# Patient Record
Sex: Female | Born: 1995 | Race: Black or African American | Hispanic: No | Marital: Single | Smoking: Never smoker
Health system: Southern US, Community
[De-identification: ages and names within clinical notes are randomized; demographics above are authoritative.]

## PROBLEM LIST (undated history)

## (undated) DIAGNOSIS — E282 Polycystic ovarian syndrome: Secondary | ICD-10-CM

## (undated) DIAGNOSIS — Z889 Allergy status to unspecified drugs, medicaments and biological substances status: Secondary | ICD-10-CM

## (undated) DIAGNOSIS — I10 Essential (primary) hypertension: Secondary | ICD-10-CM

## (undated) DIAGNOSIS — Z8759 Personal history of other complications of pregnancy, childbirth and the puerperium: Secondary | ICD-10-CM

## (undated) HISTORY — PX: NECK SURGERY: SHX720

## (undated) HISTORY — PX: MANDIBLE FRACTURE SURGERY: SHX706

## (undated) HISTORY — DX: Personal history of other complications of pregnancy, childbirth and the puerperium: Z87.59

## (undated) HISTORY — PX: TONSILLECTOMY: SUR1361

---
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
## 2018-02-17 LAB — OB RESULTS CONSOLE RUBELLA ANTIBODY, IGM: Rubella: IMMUNE

## 2018-02-17 LAB — OB RESULTS CONSOLE HEPATITIS B SURFACE ANTIGEN: Hepatitis B Surface Ag: NEGATIVE

## 2018-02-18 LAB — OB RESULTS CONSOLE GC/CHLAMYDIA
Chlamydia: NEGATIVE
Gonorrhea: NEGATIVE

## 2018-04-25 ENCOUNTER — Other Ambulatory Visit (HOSPITAL_COMMUNITY): Payer: Self-pay | Admitting: Obstetrics & Gynecology

## 2018-04-25 DIAGNOSIS — Z3689 Encounter for other specified antenatal screening: Secondary | ICD-10-CM

## 2018-04-27 ENCOUNTER — Encounter (HOSPITAL_COMMUNITY): Payer: Self-pay

## 2018-05-06 ENCOUNTER — Other Ambulatory Visit (HOSPITAL_COMMUNITY): Payer: Self-pay | Admitting: *Deleted

## 2018-05-06 ENCOUNTER — Ambulatory Visit (HOSPITAL_COMMUNITY)
Admission: RE | Admit: 2018-05-06 | Discharge: 2018-05-06 | Disposition: A | Discharge status: Home / Self Care | Payer: Medicaid Other | Source: Ambulatory Visit | Attending: Obstetrics & Gynecology | Admitting: Obstetrics & Gynecology

## 2018-05-06 DIAGNOSIS — Z3A19 19 weeks gestation of pregnancy: Secondary | ICD-10-CM | POA: Diagnosis not present

## 2018-05-06 DIAGNOSIS — Z363 Encounter for antenatal screening for malformations: Secondary | ICD-10-CM | POA: Insufficient documentation

## 2018-05-06 DIAGNOSIS — Z3689 Encounter for other specified antenatal screening: Secondary | ICD-10-CM

## 2018-05-06 DIAGNOSIS — Z362 Encounter for other antenatal screening follow-up: Secondary | ICD-10-CM

## 2018-05-11 NOTE — L&D Delivery Note (Signed)
Operative Delivery Note  Patient pushed for 1 hour after she was noted to be C/C/+2.  There was fetal bradycardia with crowning.  Discussed with patient operative delivery with a vacuum. Verbal consent: obtained from patient.Risks and benefits discussed in detail.  Risks include, but are not limited to the risks of anesthesia, bleeding, infection, damage to maternal tissues, fetal cephalhematoma.  Head determined to be direct OA. Bladder was just previously emptied by removal of foley catheter. Epidural was found to be adequate.  Vertex was +4  station.  The Kiwi vacuum was placed.  With maternal effort and 1 pull,  The head was delivered and restituted to ROA. The Anterior shoulder did not immediately deliver with gentle downward traction and a shoulder dystocia was called. Emergency team arrived.  Pediatric team was already present due to h/o antenatal cardiac arrhythmia. Simultaneous mcRoberts / suprapubic pressure was performed, a right mediolateral episiotomy was performed then Wood's Screw maneuver. The Shoulder was then delivered followed by the body w/o difficulty and the baby quickly placed on the maternal abdomen. The cord was double clamped and cut and the infant was handed to the Pediatricians.  The baby initially was stunned however tone was improved and baby noted to be moving all four extremities and there was no notable defect after the shoulder dystocia.   Cord gases were obtained followed by cord blood. Placenta spontaneously delivered intact noted to have three vessels.  Uterine atony was alleviated by massage and IV pitocin.  Second degree Episiotomy was repaired in routine fashion with 2-0 vicryl.    Patient tolerated delivery well.    Delivery of the head:  First maneuver: 09/14/2018  4:36 PM, Suprapubic Pressure McRoberts Second maneuver: 09/14/2018  4:38 PM, Joseph Art screw (fetal shoulder rotation) Third maneuver: ,   Fourth maneuver:    APGAR: 2, 9; weight 7 lb 2.1 oz (3235 g).    Placenta status: intact, 3 vessels Cord:  with the following complications: .  Cord pH: pending  Anesthesia:  Epidural / 1% lidocaine Episiotomy: Right Mediolateral Lacerations: 2nd degree Suture Repair: 2.0 vicryl Est. Blood Loss (mL):  387   Mom to postpartum.  Baby to Couplet care / Skin to Skin.  Essie Hart STACIA 09/14/2018, 5:15 PM

## 2018-06-09 ENCOUNTER — Ambulatory Visit (HOSPITAL_COMMUNITY)
Admission: RE | Admit: 2018-06-09 | Discharge: 2018-06-09 | Disposition: A | Discharge status: Home / Self Care | Payer: BLUE CROSS/BLUE SHIELD | Source: Ambulatory Visit | Attending: Obstetrics & Gynecology | Admitting: Obstetrics & Gynecology

## 2018-06-09 DIAGNOSIS — Z362 Encounter for other antenatal screening follow-up: Secondary | ICD-10-CM | POA: Diagnosis present

## 2018-06-09 DIAGNOSIS — Z3A24 24 weeks gestation of pregnancy: Secondary | ICD-10-CM | POA: Diagnosis not present

## 2018-07-05 LAB — HIV ANTIBODY (ROUTINE TESTING W REFLEX): HIV Screen 4th Generation wRfx: NONREACTIVE

## 2018-09-13 ENCOUNTER — Other Ambulatory Visit: Payer: Self-pay

## 2018-09-13 ENCOUNTER — Inpatient Hospital Stay (HOSPITAL_COMMUNITY)
Admission: AD | Admit: 2018-09-13 | Discharge: 2018-09-16 | DRG: 806 | Disposition: A | Discharge status: Home / Self Care | Payer: BLUE CROSS/BLUE SHIELD | Attending: Obstetrics & Gynecology | Admitting: Obstetrics & Gynecology

## 2018-09-13 DIAGNOSIS — Z3A38 38 weeks gestation of pregnancy: Secondary | ICD-10-CM

## 2018-09-13 DIAGNOSIS — O9081 Anemia of the puerperium: Secondary | ICD-10-CM | POA: Diagnosis not present

## 2018-09-13 DIAGNOSIS — O99824 Streptococcus B carrier state complicating childbirth: Secondary | ICD-10-CM | POA: Diagnosis present

## 2018-09-13 DIAGNOSIS — O134 Gestational [pregnancy-induced] hypertension without significant proteinuria, complicating childbirth: Secondary | ICD-10-CM | POA: Diagnosis present

## 2018-09-13 DIAGNOSIS — O368390 Maternal care for abnormalities of the fetal heart rate or rhythm, unspecified trimester, not applicable or unspecified: Secondary | ICD-10-CM

## 2018-09-13 DIAGNOSIS — O4292 Full-term premature rupture of membranes, unspecified as to length of time between rupture and onset of labor: Principal | ICD-10-CM | POA: Diagnosis present

## 2018-09-13 DIAGNOSIS — O139 Gestational [pregnancy-induced] hypertension without significant proteinuria, unspecified trimester: Secondary | ICD-10-CM | POA: Diagnosis not present

## 2018-09-13 HISTORY — DX: Polycystic ovarian syndrome: E28.2

## 2018-09-14 ENCOUNTER — Inpatient Hospital Stay (HOSPITAL_COMMUNITY): Payer: BLUE CROSS/BLUE SHIELD

## 2018-09-14 ENCOUNTER — Inpatient Hospital Stay (HOSPITAL_COMMUNITY): Payer: BLUE CROSS/BLUE SHIELD | Admitting: Anesthesiology

## 2018-09-14 ENCOUNTER — Encounter (HOSPITAL_COMMUNITY): Payer: Self-pay | Admitting: *Deleted

## 2018-09-14 ENCOUNTER — Other Ambulatory Visit: Payer: Self-pay

## 2018-09-14 DIAGNOSIS — O368330 Maternal care for abnormalities of the fetal heart rate or rhythm, third trimester, not applicable or unspecified: Secondary | ICD-10-CM | POA: Diagnosis not present

## 2018-09-14 DIAGNOSIS — O42913 Preterm premature rupture of membranes, unspecified as to length of time between rupture and onset of labor, third trimester: Secondary | ICD-10-CM

## 2018-09-14 DIAGNOSIS — Z3A38 38 weeks gestation of pregnancy: Secondary | ICD-10-CM | POA: Diagnosis not present

## 2018-09-14 DIAGNOSIS — O4292 Full-term premature rupture of membranes, unspecified as to length of time between rupture and onset of labor: Secondary | ICD-10-CM | POA: Diagnosis present

## 2018-09-14 DIAGNOSIS — O99213 Obesity complicating pregnancy, third trimester: Secondary | ICD-10-CM | POA: Diagnosis not present

## 2018-09-14 DIAGNOSIS — O9081 Anemia of the puerperium: Secondary | ICD-10-CM | POA: Diagnosis not present

## 2018-09-14 DIAGNOSIS — O99824 Streptococcus B carrier state complicating childbirth: Secondary | ICD-10-CM | POA: Diagnosis present

## 2018-09-14 DIAGNOSIS — O134 Gestational [pregnancy-induced] hypertension without significant proteinuria, complicating childbirth: Secondary | ICD-10-CM | POA: Diagnosis present

## 2018-09-14 DIAGNOSIS — O139 Gestational [pregnancy-induced] hypertension without significant proteinuria, unspecified trimester: Secondary | ICD-10-CM | POA: Diagnosis not present

## 2018-09-14 LAB — COMPREHENSIVE METABOLIC PANEL
ALT: 22 U/L (ref 0–44)
AST: 25 U/L (ref 15–41)
Albumin: 2.6 g/dL — ABNORMAL LOW (ref 3.5–5.0)
Alkaline Phosphatase: 213 U/L — ABNORMAL HIGH (ref 38–126)
Anion gap: 9 (ref 5–15)
BUN: 6 mg/dL (ref 6–20)
CO2: 22 mmol/L (ref 22–32)
Calcium: 9.4 mg/dL (ref 8.9–10.3)
Chloride: 107 mmol/L (ref 98–111)
Creatinine, Ser: 0.72 mg/dL (ref 0.44–1.00)
GFR calc Af Amer: >60 mL/min (ref >60)
GFR calc non Af Amer: >60 mL/min (ref >60)
Glucose, Bld: 88 mg/dL (ref 70–99)
Potassium: 4 mmol/L (ref 3.5–5.1)
Sodium: 138 mmol/L (ref 135–145)
Total Bilirubin: 0.5 mg/dL (ref 0.3–1.2)
Total Protein: 6.3 g/dL — ABNORMAL LOW (ref 6.5–8.1)

## 2018-09-14 LAB — CBC
HCT: 32.5 % — ABNORMAL LOW (ref 36.0–46.0)
Hemoglobin: 11.8 g/dL — ABNORMAL LOW (ref 12.0–15.0)
MCH: 28.2 pg (ref 26.0–34.0)
MCHC: 36.3 g/dL — ABNORMAL HIGH (ref 30.0–36.0)
MCV: 77.6 fL — ABNORMAL LOW (ref 80.0–100.0)
Platelets: 198 10*3/uL (ref 150–400)
RBC: 4.19 MIL/uL (ref 3.87–5.11)
RDW: 13.2 % (ref 11.5–15.5)
WBC: 10 10*3/uL (ref 4.0–10.5)
nRBC: 0 % (ref 0.0–0.2)

## 2018-09-14 LAB — PROTEIN / CREATININE RATIO, URINE
Creatinine, Urine: 175.7 mg/dL
Protein Creatinine Ratio: 0.1 mg/mg{Cre} (ref 0.00–0.15)
Total Protein, Urine: 17 mg/dL

## 2018-09-14 LAB — CBC WITH DIFFERENTIAL/PLATELET
Abs Immature Granulocytes: 0.21 10*3/uL — ABNORMAL HIGH (ref 0.00–0.07)
Basophils Absolute: 0 10*3/uL (ref 0.0–0.1)
Basophils Relative: 0 %
Eosinophils Absolute: 0 10*3/uL (ref 0.0–0.5)
Eosinophils Relative: 0 %
HCT: 24.1 % — ABNORMAL LOW (ref 36.0–46.0)
Hemoglobin: 8.7 g/dL — ABNORMAL LOW (ref 12.0–15.0)
Immature Granulocytes: 1 %
Lymphocytes Relative: 9 %
Lymphs Abs: 1.7 10*3/uL (ref 0.7–4.0)
MCH: 28 pg (ref 26.0–34.0)
MCHC: 36.1 g/dL — ABNORMAL HIGH (ref 30.0–36.0)
MCV: 77.5 fL — ABNORMAL LOW (ref 80.0–100.0)
Monocytes Absolute: 1.8 10*3/uL — ABNORMAL HIGH (ref 0.1–1.0)
Monocytes Relative: 10 %
Neutro Abs: 14.5 10*3/uL — ABNORMAL HIGH (ref 1.7–7.7)
Neutrophils Relative %: 80 %
Platelets: 163 10*3/uL (ref 150–400)
RBC: 3.11 MIL/uL — ABNORMAL LOW (ref 3.87–5.11)
RDW: 13.3 % (ref 11.5–15.5)
WBC: 18.2 10*3/uL — ABNORMAL HIGH (ref 4.0–10.5)
nRBC: 0 % (ref 0.0–0.2)

## 2018-09-14 LAB — POCT FERN TEST: POCT Fern Test: POSITIVE

## 2018-09-14 LAB — URIC ACID: Uric Acid, Serum: 5.6 mg/dL (ref 2.5–7.1)

## 2018-09-14 LAB — TYPE AND SCREEN
ABO/RH(D): O POS
Antibody Screen: NEGATIVE

## 2018-09-14 LAB — OB RESULTS CONSOLE ABO/RH: RH Type: POSITIVE

## 2018-09-14 LAB — OB RESULTS CONSOLE GBS: GBS: POSITIVE

## 2018-09-14 LAB — LACTATE DEHYDROGENASE: LDH: 172 U/L (ref 98–192)

## 2018-09-14 LAB — ABO/RH: ABO/RH(D): O POS

## 2018-09-14 LAB — RPR: RPR Ser Ql: NONREACTIVE

## 2018-09-14 MED ORDER — ACETAMINOPHEN 325 MG PO TABS: 650.0000 mg | ORAL_TABLET | ORAL | Status: DC | PRN

## 2018-09-14 MED ORDER — SOD CITRATE-CITRIC ACID 500-334 MG/5ML PO SOLN: 30.0000 mL | ORAL | Status: DC | PRN

## 2018-09-14 MED ORDER — SODIUM CHLORIDE 0.9 % IV SOLN: 250.0000 mL | INTRAVENOUS | Status: DC | PRN

## 2018-09-14 MED ORDER — OXYCODONE-ACETAMINOPHEN 5-325 MG PO TABS: 2.0000 | ORAL_TABLET | ORAL | Status: DC | PRN

## 2018-09-14 MED ORDER — PHENYLEPHRINE 40 MCG/ML (10ML) SYRINGE FOR IV PUSH (FOR BLOOD PRESSURE SUPPORT): 80.0000 ug | PREFILLED_SYRINGE | INTRAVENOUS | Status: DC | PRN

## 2018-09-14 MED ORDER — PRENATAL MULTIVITAMIN CH
1.0000 | ORAL_TABLET | Freq: Every day | ORAL | Status: DC
  Administered 2018-09-15 – 2018-09-16 (×2): 1 via ORAL
  Filled 2018-09-14 (×2): qty 1

## 2018-09-14 MED ORDER — PENICILLIN G 3 MILLION UNITS IVPB - SIMPLE MED
3.0000 10*6.[IU] | INTRAVENOUS | Status: DC
  Administered 2018-09-14 (×4): 3 10*6.[IU] via INTRAVENOUS
  Filled 2018-09-14 (×3): qty 100

## 2018-09-14 MED ORDER — LACTATED RINGERS IV SOLN: 500.0000 mL | Freq: Once | INTRAVENOUS | Status: DC

## 2018-09-14 MED ORDER — MISOPROSTOL 100 MCG PO TABS
100.0000 ug | ORAL_TABLET | Freq: Once | ORAL | Status: AC
  Administered 2018-09-14: 100 ug via RECTAL
  Filled 2018-09-14: qty 1

## 2018-09-14 MED ORDER — METHYLERGONOVINE MALEATE 0.2 MG/ML IJ SOLN
0.2000 mg | Freq: Once | INTRAMUSCULAR | Status: AC
  Administered 2018-09-14: 0.2 mg via INTRAMUSCULAR
  Filled 2018-09-14: qty 1

## 2018-09-14 MED ORDER — OXYTOCIN 40 UNITS IN NORMAL SALINE INFUSION - SIMPLE MED
1.0000 m[IU]/min | INTRAVENOUS | Status: DC
  Administered 2018-09-14: 2 m[IU]/min via INTRAVENOUS

## 2018-09-14 MED ORDER — FENTANYL CITRATE (PF) 100 MCG/2ML IJ SOLN
50.0000 ug | INTRAMUSCULAR | Status: DC | PRN
  Administered 2018-09-14: 50 ug via INTRAVENOUS
  Filled 2018-09-14 (×2): qty 2

## 2018-09-14 MED ORDER — SENNOSIDES-DOCUSATE SODIUM 8.6-50 MG PO TABS
2.0000 | ORAL_TABLET | ORAL | Status: DC
  Administered 2018-09-14 – 2018-09-15 (×2): 2 via ORAL
  Filled 2018-09-14 (×2): qty 2

## 2018-09-14 MED ORDER — OXYTOCIN 40 UNITS IN NORMAL SALINE INFUSION - SIMPLE MED: 2.5000 [IU]/h | INTRAVENOUS | Status: DC | PRN

## 2018-09-14 MED ORDER — SODIUM CHLORIDE (PF) 0.9 % IJ SOLN
INTRAMUSCULAR | Status: DC | PRN
  Administered 2018-09-14: 14 mL/h via EPIDURAL

## 2018-09-14 MED ORDER — FENTANYL-BUPIVACAINE-NACL 0.5-0.125-0.9 MG/250ML-% EP SOLN
EPIDURAL | Status: AC
  Filled 2018-09-14: qty 250

## 2018-09-14 MED ORDER — SIMETHICONE 80 MG PO CHEW: 80.0000 mg | CHEWABLE_TABLET | ORAL | Status: DC | PRN

## 2018-09-14 MED ORDER — ONDANSETRON HCL 4 MG/2ML IJ SOLN: 4.0000 mg | INTRAMUSCULAR | Status: DC | PRN

## 2018-09-14 MED ORDER — LIDOCAINE HCL (PF) 1 % IJ SOLN
30.0000 mL | INTRAMUSCULAR | Status: AC | PRN
  Administered 2018-09-14: 30 mL via SUBCUTANEOUS
  Filled 2018-09-14: qty 30

## 2018-09-14 MED ORDER — OXYTOCIN BOLUS FROM INFUSION
500.0000 mL | Freq: Once | INTRAVENOUS | Status: AC
  Administered 2018-09-14: 500 mL via INTRAVENOUS

## 2018-09-14 MED ORDER — LACTATED RINGERS IV SOLN: INTRAVENOUS | Status: DC

## 2018-09-14 MED ORDER — ONDANSETRON HCL 4 MG/2ML IJ SOLN
4.0000 mg | Freq: Four times a day (QID) | INTRAMUSCULAR | Status: DC | PRN
  Administered 2018-09-14: 4 mg via INTRAVENOUS
  Filled 2018-09-14: qty 2

## 2018-09-14 MED ORDER — PHENYLEPHRINE 40 MCG/ML (10ML) SYRINGE FOR IV PUSH (FOR BLOOD PRESSURE SUPPORT)
PREFILLED_SYRINGE | INTRAVENOUS | Status: AC
  Filled 2018-09-14: qty 10

## 2018-09-14 MED ORDER — DIPHENHYDRAMINE HCL 25 MG PO CAPS: 25.0000 mg | ORAL_CAPSULE | Freq: Four times a day (QID) | ORAL | Status: DC | PRN

## 2018-09-14 MED ORDER — LACTATED RINGERS IV SOLN
INTRAVENOUS | Status: DC
  Administered 2018-09-14: 06:00:00 via INTRAVENOUS

## 2018-09-14 MED ORDER — BENZOCAINE-MENTHOL 20-0.5 % EX AERO: 1.0000 "application " | INHALATION_SPRAY | CUTANEOUS | Status: DC | PRN

## 2018-09-14 MED ORDER — COCONUT OIL OIL: 1.0000 "application " | TOPICAL_OIL | Status: DC | PRN

## 2018-09-14 MED ORDER — IBUPROFEN 600 MG PO TABS
600.0000 mg | ORAL_TABLET | Freq: Four times a day (QID) | ORAL | Status: DC
  Administered 2018-09-14 – 2018-09-16 (×8): 600 mg via ORAL
  Filled 2018-09-14 (×8): qty 1

## 2018-09-14 MED ORDER — TERBUTALINE SULFATE 1 MG/ML IJ SOLN: 0.2500 mg | Freq: Once | INTRAMUSCULAR | Status: DC | PRN

## 2018-09-14 MED ORDER — METHYLERGONOVINE MALEATE 0.2 MG PO TABS
0.2000 mg | ORAL_TABLET | Freq: Four times a day (QID) | ORAL | Status: AC
  Administered 2018-09-15 (×4): 0.2 mg via ORAL
  Filled 2018-09-14 (×4): qty 1

## 2018-09-14 MED ORDER — FERROUS SULFATE 325 (65 FE) MG PO TABS
325.0000 mg | ORAL_TABLET | Freq: Two times a day (BID) | ORAL | Status: DC
  Administered 2018-09-15 – 2018-09-16 (×4): 325 mg via ORAL
  Filled 2018-09-14 (×4): qty 1

## 2018-09-14 MED ORDER — TETANUS-DIPHTH-ACELL PERTUSSIS 5-2.5-18.5 LF-MCG/0.5 IM SUSP: 0.5000 mL | Freq: Once | INTRAMUSCULAR | Status: DC

## 2018-09-14 MED ORDER — ONDANSETRON HCL 4 MG PO TABS: 4.0000 mg | ORAL_TABLET | ORAL | Status: DC | PRN

## 2018-09-14 MED ORDER — SODIUM CHLORIDE 0.9 % IV SOLN
5.0000 10*6.[IU] | Freq: Once | INTRAVENOUS | Status: AC
  Administered 2018-09-14: 5 10*6.[IU] via INTRAVENOUS
  Filled 2018-09-14: qty 5

## 2018-09-14 MED ORDER — OXYTOCIN 40 UNITS IN NORMAL SALINE INFUSION - SIMPLE MED
2.5000 [IU]/h | INTRAVENOUS | Status: DC
  Filled 2018-09-14: qty 1000

## 2018-09-14 MED ORDER — ZOLPIDEM TARTRATE 5 MG PO TABS: 5.0000 mg | ORAL_TABLET | Freq: Every evening | ORAL | Status: DC | PRN

## 2018-09-14 MED ORDER — MISOPROSTOL 50MCG HALF TABLET
50.0000 ug | ORAL_TABLET | Freq: Once | ORAL | Status: DC
  Filled 2018-09-14: qty 1

## 2018-09-14 MED ORDER — MISOPROSTOL 200 MCG PO TABS
ORAL_TABLET | ORAL | Status: AC
  Filled 2018-09-14: qty 5

## 2018-09-14 MED ORDER — MISOPROSTOL 200 MCG PO TABS
1000.0000 ug | ORAL_TABLET | Freq: Once | ORAL | Status: AC
  Administered 2018-09-14: 1000 ug via RECTAL
  Filled 2018-09-14: qty 10

## 2018-09-14 MED ORDER — EPHEDRINE 5 MG/ML INJ: 10.0000 mg | INTRAVENOUS | Status: DC | PRN

## 2018-09-14 MED ORDER — DIPHENHYDRAMINE HCL 50 MG/ML IJ SOLN: 12.5000 mg | INTRAMUSCULAR | Status: DC | PRN

## 2018-09-14 MED ORDER — WITCH HAZEL-GLYCERIN EX PADS: 1.0000 "application " | MEDICATED_PAD | CUTANEOUS | Status: DC | PRN

## 2018-09-14 MED ORDER — ACETAMINOPHEN 325 MG PO TABS
650.0000 mg | ORAL_TABLET | ORAL | Status: DC | PRN
  Administered 2018-09-14 – 2018-09-15 (×3): 650 mg via ORAL
  Filled 2018-09-14 (×3): qty 2

## 2018-09-14 MED ORDER — MISOPROSTOL 200 MCG PO TABS
ORAL_TABLET | ORAL | Status: AC
  Administered 2018-09-14: 1000 ug via RECTAL
  Filled 2018-09-14: qty 5

## 2018-09-14 MED ORDER — OXYCODONE HCL 5 MG PO TABS
10.0000 mg | ORAL_TABLET | ORAL | Status: DC | PRN
  Administered 2018-09-14 – 2018-09-15 (×2): 10 mg via ORAL
  Filled 2018-09-14 (×2): qty 2

## 2018-09-14 MED ORDER — DIBUCAINE (PERIANAL) 1 % EX OINT: 1.0000 "application " | TOPICAL_OINTMENT | CUTANEOUS | Status: DC | PRN

## 2018-09-14 MED ORDER — SODIUM CHLORIDE 0.9% FLUSH: 3.0000 mL | Freq: Two times a day (BID) | INTRAVENOUS | Status: DC

## 2018-09-14 MED ORDER — SODIUM CHLORIDE 0.9% FLUSH: 3.0000 mL | INTRAVENOUS | Status: DC | PRN

## 2018-09-14 MED ORDER — FENTANYL CITRATE (PF) 100 MCG/2ML IJ SOLN
50.0000 ug | Freq: Once | INTRAMUSCULAR | Status: AC
  Administered 2018-09-14: 50 ug via INTRAVENOUS
  Filled 2018-09-14: qty 2

## 2018-09-14 MED ORDER — LACTATED RINGERS IV SOLN
500.0000 mL | INTRAVENOUS | Status: DC | PRN
  Administered 2018-09-14: 999 mL via INTRAVENOUS

## 2018-09-14 MED ORDER — FENTANYL-BUPIVACAINE-NACL 0.5-0.125-0.9 MG/250ML-% EP SOLN: 12.0000 mL/h | EPIDURAL | Status: DC | PRN

## 2018-09-14 MED ORDER — LIDOCAINE HCL (PF) 1 % IJ SOLN
INTRAMUSCULAR | Status: DC | PRN
  Administered 2018-09-14: 11 mL via EPIDURAL

## 2018-09-14 MED ORDER — OXYCODONE-ACETAMINOPHEN 5-325 MG PO TABS: 1.0000 | ORAL_TABLET | ORAL | Status: DC | PRN

## 2018-09-14 NOTE — Anesthesia Preprocedure Evaluation (Signed)
Anesthesia Evaluation  Patient identified by MRN, date of birth, ID band Patient awake    Reviewed: Allergy & Precautions, NPO status , Patient's Chart, lab work & pertinent test results  Airway Mallampati: II  TM Distance: >3 FB Neck ROM: Full    Dental no notable dental hx.    Pulmonary neg pulmonary ROS,    Pulmonary exam normal breath sounds clear to auscultation       Cardiovascular negative cardio ROS Normal cardiovascular exam Rhythm:Regular Rate:Normal     Neuro/Psych negative neurological ROS  negative psych ROS   GI/Hepatic negative GI ROS, Neg liver ROS,   Endo/Other  negative endocrine ROS  Renal/GU negative Renal ROS  negative genitourinary   Musculoskeletal negative musculoskeletal ROS (+)   Abdominal (+) + obese,   Peds negative pediatric ROS (+)  Hematology negative hematology ROS (+)   Anesthesia Other Findings   Reproductive/Obstetrics (+) Pregnancy                             Anesthesia Physical Anesthesia Plan  ASA: II  Anesthesia Plan: Epidural   Post-op Pain Management:    Induction:   PONV Risk Score and Plan:   Airway Management Planned:   Additional Equipment:   Intra-op Plan:   Post-operative Plan:   Informed Consent:   Plan Discussed with:   Anesthesia Plan Comments:         Anesthesia Quick Evaluation  

## 2018-09-14 NOTE — Progress Notes (Signed)
[name] is a 23 y.o. G1P0 at 88 weeks admitted for rupture of membranes, early labor.  Subjective: Patient comfortable with epidural. Per Dr. Irineo Axon recommendation, discussed placing FSE to better monitor baby's heartrate. Patient consented. Arrhythmia is more audible with FSE in place, fetal heart rate will have runs of multiple skipped beats in a short period of time and then have a longer section of time with a regular heartbeat. The dropped/skipped beats occur irregularly.   Objective: Vitals:   09/14/18 0405 09/14/18 0409 09/14/18 0410 09/14/18 0601  BP: 132/81  129/77 128/76  Pulse: (!) 106  (!) 103 96  Resp: 18  18 18   Temp:  98.3 F (36.8 C)    TempSrc:  Oral    SpO2: 100%  100%   Weight:      Height:      ' FHT:  FHR: 140s bpm, variability: moderate,  accelerations:  Present,  decelerations:  Absent, arrhythmia presents on tracing as artifact UC:   regular, every 2-3 minutes SVE:   Dilation: 4 Effacement (%): 90 Station: -1 Exam by:: [employer]  Labs: Lab Results  Component Value Date   WBC 10.0 09/14/2018   HGB 11.8 (L) 09/14/2018   HCT 32.5 (L) 09/14/2018   MCV 77.6 (L) 09/14/2018   PLT 198 09/14/2018    Assessment / Plan: Spontaneous labor, progressing normally  Labor: Progressing normally Preeclampsia:  no signs or symptoms of toxicity, intake and ouput balanced and labs stable Fetal Wellbeing:  Category I Pain Control:  Epidural I/D:  n/a Anticipated MOD:  NSVD  Janeece Riggers 09/14/2018, 6:10 AM

## 2018-09-14 NOTE — Progress Notes (Signed)
Called to patient's room by nurse tech at 1930. Entered room to find patient in the bathroom with nurse tech. Nurse tech informed RN 1 that patient had passed a large clot in her pad and had also passed another large clot in the toilet. Patient stated she felt "hot". Vital signs taken. BP 105/65, HR 121, RR 20, Temp 99.5 and O2 sat 95%. RN remained with patient while nurse tech grabbed supplies. RN exited room to call for a triton scale to weigh blood loss. Patient's off-going RN, RN 2, entered room with RN 1 to assist. Patient returned to bed using the stedy due to complaints of feeling dizzy. Charge nurse was called to room for assistance. RN 2 and charge nurse performed fundal massage while RN 1 weighed pads. EBL 256 per triton scale. RN 2 reported fundus was now 3/U after it had been 1/U prior to clots. Charge nurse informed patient that we would be calling her physician to see what they would like [place] to do. RN 1 called physician. Spoke with Dr. Sallye Ober. Given orders for Cytotec rectally and informed that [facility], CNM would be coming to look at patient. Charge nurse accompanied CNM into patients room. CNM wanted to bladder scan patient and go from there. Given verbal order for 50 mcg of Fentanyl IV. RN 1 accompanied CNM to patient's room. Bladder scanner showed only 28 mLs in the bladder. CNM decided to attempt a manual extraction on the patient. Patient given 650 mg Tylenol, 600 mg Motrin. Attempted to give fentanyl IV push but was unsuccessful due to IV occlusion. IV removed by charge nurse and fentanyl given IM per CNM instructions. Also given verbal order for Oxycodone rapid release 10 mg. Manual extraction attempted with only small amounts of clot removed. CNM reported patient's fundus was "coming down", so she wanted to go ahead and give the cytotec. 1000 mg Cytotec given rectally. Patient and family informed of the plan to keep a close eye on her bleeding and to call if any more bleeding or clots  occurred. RN told to notify CNM if needed.

## 2018-09-14 NOTE — MAU Note (Signed)
Pt came to hospital with c/o vag spotting and contractions and once arrived her water broke. Baby moving well.

## 2018-09-14 NOTE — Lactation Note (Signed)
This note was copied from a baby's chart. Lactation Consultation Note Baby 5 hrs old. Mom states BF well after delivery but hasn't wanted to feed since.  Mom holding baby STS in cradle position. LC attempted to latch baby. Baby took a couple of sucks went to sleep. Mom cont. To hold STS. Mom had PPH after deliver, hasn't had a lot of cuddle time per mom. Mom has DX: of PCOS. Space between tubular breast noted. Lt. Breast size larger than Rt. Breast. Mom shown how to use DEBP & how to disassemble, clean, & reassemble parts. Encouraged to pump 5-6 times a day at this time. Hand expression demonstrated drop of colostrum noted. Nipples are short shaft, everts well w/stimulation. Newborn behavior, STS, I&O, cluster feeding, breast massage, positioning, support, body alignment, supply and demand discussed. Encouraged mom to stimulate baby to feed q 3 hrs if hasn't cued. Encouraged to call for assistance if needed. Brochure left at bedside.  Patient Name: [name] Today's Date: 09/14/2018 Reason for consult: Initial assessment;1st time breastfeeding;Early term 37-38.6wks;Maternal endocrine disorder Type of Endocrine Disorder?: PCOS   Maternal Data Has patient been taught Hand Expression?: Yes Does the patient have breastfeeding experience prior to this delivery?: No  Feeding Feeding Type: Breast Fed  LATCH Score Latch: Too sleepy or reluctant, no latch achieved, no sucking elicited.  Audible Swallowing: None  Type of Nipple: Everted at rest and after stimulation(very short shaft)  Comfort (Breast/Nipple): Soft / non-tender  Hold (Positioning): Full assist, staff holds infant at breast  LATCH Score: 4  Interventions Interventions: Breast feeding basics reviewed;Breast compression;Assisted with latch;Adjust position;Skin to skin;Support pillows;DEBP;Breast massage;Position options;Hand express;Pre-pump if needed;Shells  Lactation Tools Discussed/Used Tools: Pump Breast pump  type: Double-Electric Breast Pump WIC Program: Yes Pump Review: Setup, frequency, and cleaning;Milk Storage Initiated by:: Peri Jefferson RN IBCLC Date initiated:: 09/14/18   Consult Status Consult Status: Follow-up Date: 09/15/18 Follow-up type: In-patient    Charyl Dancer 09/14/2018, 10:28 PM

## 2018-09-14 NOTE — H&P (Signed)
Catherine Catherine BuntingM Lucero is a 23 y.o. female presenting for rupture of membranes and early labor. OB History    Gravida  1   Para      Term      Preterm      AB      Living        SAB      TAB      Ectopic      Multiple      Live Births             Past Medical History:  Diagnosis Date  . PCOS (polycystic ovarian syndrome)    Past Surgical History:  Procedure Laterality Date  . MANDIBLE FRACTURE SURGERY    . NECK SURGERY    . TONSILLECTOMY     Family History: family history is not on file. Social History:  reports that she has never smoked. She has never used smokeless tobacco. She reports that she does not drink alcohol or use drugs.     Maternal Diabetes: No Genetic Screening: Normal Maternal Ultrasounds/Referrals: Normal Fetal Ultrasounds or other Referrals:  Referred to Materal Fetal Medicine , Other: U/S 09/08/18 for S>D, VTX, POSTERIOR PLACENTA, AFI 20.8, EFW 6+14, 57%ILE, HC 13%. Maternal Substance Abuse:  No Significant Maternal Medications:  None Significant Maternal Lab Results:  Lab values include: Group B Strep positive Other Comments:  None  Review of Systems  Eyes: Negative for blurred vision and double vision.  Gastrointestinal: Positive for abdominal pain.  Neurological: Negative for headaches.  All other systems reviewed and are negative.  Maternal Medical History:  Reason for admission: Rupture of membranes and contractions.   Contractions: Onset was 3-5 hours ago.   Frequency: regular.   Perceived severity is strong.    Fetal activity: Perceived fetal activity is normal.   Last perceived fetal movement was within the past hour.    Prenatal Complications - Diabetes: none.    Dilation: 1.5 Effacement (%): 70 Station: 0 Exam by:: Catherine Perlhandra Phillips RN   Vitals:   09/14/18 0045 09/14/18 0137 09/14/18 0203 09/14/18 0209  BP: (!) 141/94 134/71    Pulse: 99 90    Resp: 18 18    Temp: 98.4 F (36.9 C)   98.3 F (36.8 C)  TempSrc:  Oral   Oral  SpO2: 100%     Weight:   109.3 kg   Height:   5\' 9"  (1.753 m)    Results for orders placed or performed during the hospital encounter of 09/13/18 (from the past 24 hour(s))  POCT fern test     Status: None   Collection Time: 09/14/18 12:47 AM  Result Value Ref Range   POCT Fern Test Positive = ruptured amniotic membanes   Type and screen [facility]     Status: None   Collection Time: 09/14/18  1:00 AM  Result Value Ref Range   ABO/RH(D) O POS    Antibody Screen NEG    Sample Expiration      09/17/2018,2359 Performed at [facility], [address] [place], [place] [address]   ABO/Rh     Status: None   Collection Time: 09/14/18  1:10 AM  Result Value Ref Range   ABO/RH(D)      O POS Performed at [facility], [address] [place], [place] [address]   CBC     Status: Abnormal   Collection Time: 09/14/18  1:17 AM  Result Value Ref Range  WBC 10.0 4.0 - 10.5 K/uL   RBC 4.19 3.87 - 5.11 MIL/uL   Hemoglobin 11.8 (L) 12.0 - 15.0 g/dL   HCT 09.3 (L) 81.8 - 29.9 %   MCV 77.6 (L) 80.0 - 100.0 fL   MCH 28.2 26.0 - 34.0 pg   MCHC 36.3 (H) 30.0 - 36.0 g/dL   RDW 37.1 69.6 - 78.9 %   Platelets 198 150 - 400 K/uL   nRBC 0.0 0.0 - 0.2 %  Comprehensive metabolic panel     Status: Abnormal   Collection Time: 09/14/18  1:17 AM  Result Value Ref Range   Sodium 138 135 - 145 mmol/L   Potassium 4.0 3.5 - 5.1 mmol/L   Chloride 107 98 - 111 mmol/L   CO2 22 22 - 32 mmol/L   Glucose, Bld 88 70 - 99 mg/dL   BUN 6 6 - 20 mg/dL   Creatinine, Ser 3.81 0.44 - 1.00 mg/dL   Calcium 9.4 8.9 - 01.7 mg/dL   Total Protein 6.3 (L) 6.5 - 8.1 g/dL   Albumin 2.6 (L) 3.5 - 5.0 g/dL   AST 25 15 - 41 U/L   ALT 22 0 - 44 U/L   Alkaline Phosphatase 213 (H) 38 - 126 U/L   Total Bilirubin 0.5 0.3 - 1.2 mg/dL   GFR calc non Af Amer >60 >60 mL/min   GFR calc Af Amer >60 >60 mL/min   Anion gap 9 5 - 15    Maternal Exam:  Abdomen: Fundal height is Size=dates.    Estimated fetal weight is 7lbs.    Introitus: Normal vulva. Normal vagina.  Pelvis: adequate for delivery.   Cervix: Cervix evaluated by digital exam.     Fetal Exam Fetal Monitor Review: Mode: ultrasound.   Baseline rate: 140s.  Variability: moderate (6-25 bpm).   Pattern: accelerations present and no decelerations.    Fetal State Assessment: Category I - tracings are normal.    Audible fetal heart arrhythmia. Arrythmia is noted as drop or skipped heart beat which occurs irregularly.  Physical Exam  Genitourinary:    Vulva normal.     Prenatal labs: ABO, Rh: --/--/O POS Performed at [facility], [address] [place], [place] [address]  [phone]) Antibody: NEG (05/06 0100) Rubella:  Immune RPR:   NR HBsAg:   NR HIV:   NR GBS:   Positive   Assessment/Plan: 23 y.o. G1P0 at 38w Rupture of membranes in early labor Category 1 FHTs, reactive NST Consulted Dr. Sallye Ober regarding fetal heart arrhythmia:  -FSE when able to place  -NICU present at delivery  -Ultrasound to assess fetal cardiac activity and rule out evidence of hydrops  Elevated BPs seen, patient denies symptoms of preeclampsia, PIH labs pending  Admit to L&D Penicillin for GBS+ Anticipate NSVD  [name] 09/14/2018, 2:19 AM

## 2018-09-14 NOTE — Progress Notes (Signed)
[name] is a 23 y.o. G1P0 at [redacted]w[redacted]d by LMP admitted for PROM  Subjective: Patient comfortable with epidural   Objective: BP (!) 142/83   Pulse (!) 103   Temp 98.4 F (36.9 C) (Oral)   Resp 18   Ht 5\' 9"  (1.753 m)   Wt 109.3 kg   SpO2 100%   BMI 35.59 kg/m  I/O last 3 completed shifts: In: -  Out: 100 [Urine:100] No intake/output data recorded.  FHT:  FHR: 145 bpm, variability: moderate,  accelerations:  Present,  decelerations:  Absent UC:   irregular, every 4-10 minutes SVE:   Dilation: 5 Effacement (%): 80 Station: -2 Exam by: Dr. Mora Appl  Labs: Lab Results  Component Value Date   WBC 10.0 09/14/2018   HGB 11.8 (L) 09/14/2018   HCT 32.5 (L) 09/14/2018   MCV 77.6 (L) 09/14/2018   PLT 198 09/14/2018    Assessment / Plan: SIUP at term PROM Will augment labor with Pitocin 2x2 Patient comfortable with epidural  Cat I tracing IUPC placed as Toco not picking up ctx    Takoya Jonas STACIA 09/14/2018, 7:49 AM

## 2018-09-14 NOTE — Anesthesia Procedure Notes (Signed)
Epidural Patient location during procedure: OB Start time: 09/14/2018 3:33 AM End time: 09/14/2018 3:48 AM  Staffing Anesthesiologist: Lowella Curb, MD Performed: anesthesiologist   Preanesthetic Checklist Completed: patient identified, site marked, surgical consent, pre-op evaluation, timeout performed, IV checked, risks and benefits discussed and monitors and equipment checked  Epidural Patient position: sitting Prep: ChloraPrep Patient monitoring: heart rate, cardiac monitor, continuous pulse ox and blood pressure Approach: midline Location: L2-L3 Injection technique: LOR saline  Needle:  Needle type: Tuohy  Needle gauge: 17 G Needle length: 9 cm Needle insertion depth: 7 cm Catheter type: closed end flexible Catheter size: 20 Guage Catheter at skin depth: 12 cm Test dose: negative  Assessment Events: blood not aspirated, injection not painful, no injection resistance, negative IV test and no paresthesia  Additional Notes Reason for block:procedure for pain

## 2018-09-14 NOTE — Progress Notes (Signed)
[place] KAMARIYA IVERSEN is a 23 y.o. G1P0 at [redacted]w[redacted]d by LMP admitted for PROM  Subjective: Patient feeling more pressure  Objective: BP 121/79   Pulse 97   Temp 98.6 F (37 C) (Oral)   Resp 20   Ht 5\' 9"  (1.753 m)   Wt 109.3 kg   SpO2 100%   BMI 35.59 kg/m  I/O last 3 completed shifts: In: -  Out: 100 [Urine:100] Total I/O In: -  Out: 700 [Urine:700]  FHT:  FHR: 140 bpm, variability: moderate,  accelerations:  Present,  decelerations:  Absent UC:   regular, every 2-3 minutes SVE:   Dilation: 8.5 Effacement (%): 90 Station: Plus 1 Exam by:: Dr. Mora Appl  Labs: Lab Results  Component Value Date   WBC 10.0 09/14/2018   HGB 11.8 (L) 09/14/2018   HCT 32.5 (L) 09/14/2018   MCV 77.6 (L) 09/14/2018   PLT 198 09/14/2018    Assessment / Plan: Augmentation of labor, progressing well  Labor: Progressing normally Preeclampsia:  no signs or symptoms of toxicity Fetal Wellbeing:  Category I Pain Control:  Epidural I/D:  n/a Anticipated MOD:  NSVD  Dorean Daniello STACIA 09/14/2018, 12:47 PM

## 2018-09-14 NOTE — Progress Notes (Addendum)
Subjective: Postpartum Day # 0 : S/P NSVD due to AROM with early labor. Pt delivery consisted of vacuumed assisted with shoulder dystocia and medio-lateral episiotomy.  Patient up ad lib, denies syncope or dizziness. Reports consuming regular diet without issues and denies N/V. Patient reports 0 bowel movement + passing flatus.  Denies issues with urination and reports bleeding is "lstill moderate." RN reported having several clots in toilet post urinating. Fundal height was reported to be @ +3.  Patient is breastfeeding and reports going well.  Desires unknwo for postpartum contraception.  Pain is being appropriately managed with use of po meds. Pt stable denies syncopy, heart palppitation ,.denies dizxiness, denies cp or sob. Pt denies HA, RUQ pain or vision changes.   2nf medio-lateral episiotomy laceration Feeding:  Breast Contraceptive plan:  unknown BB: Circ out pt desired.   Objective: Vital signs in last 24 hours: Patient Vitals for the past 24 hrs:  BP Temp Temp src Pulse Resp SpO2 Height Weight  09/14/18 1930 105/65 99.5 F (37.5 C) Oral (!) 121 20 95 % - -  09/14/18 1845 128/89 99.5 F (37.5 C) Axillary (!) 119 19 - - -  09/14/18 1802 (!) 133/97 99.9 F (37.7 C) Axillary (!) 120 18 - - -  09/14/18 1746 (!) 146/93 - - (!) 110 20 - - -  09/14/18 1731 133/85 - - (!) 114 18 - - -  09/14/18 1716 (!) 148/101 - - (!) 118 16 - - -  09/14/18 1701 (!) 126/100 - - - 18 - - -  09/14/18 1648 (!) 132/95 - - (!) 124 - - - -  09/14/18 1501 121/66 99.5 F (37.5 C) Oral (!) 104 20 - - -  09/14/18 1402 (!) 115/58 99.4 F (37.4 C) Oral (!) 105 16 - - -  09/14/18 1330 120/61 - - 97 18 - - -  09/14/18 1300 (!) 139/91 99 F (37.2 C) Oral (!) 114 20 - - -  09/14/18 1230 121/79 - - 97 20 - - -  09/14/18 1201 100/62 - - 99 16 - - -  09/14/18 1131 (!) 107/52 - - 94 16 - - -  09/14/18 1101 (!) 108/56 98.6 F (37 C) Oral 96 18 - - -  09/14/18 1030 - - - - 16 - - -  09/14/18 1002 131/87 - - 97 - - -  -  09/14/18 0930 - 99.1 F (37.3 C) Oral - - - - -  09/14/18 0901 116/65 - - (!) 106 18 - - -  09/14/18 0801 103/64 - - (!) 122 16 - - -  09/14/18 0730 - 98.9 F (37.2 C) Oral - - - - -  09/14/18 0701 (!) 142/83 - - (!) 103 18 - - -  09/14/18 0644 - 98.4 F (36.9 C) Oral - - - - -  09/14/18 0601 128/76 - - 96 18 - - -  09/14/18 0410 129/77 - - (!) 103 18 100 % - -  09/14/18 0409 - 98.3 F (36.8 C) Oral - - - - -  09/14/18 0405 132/81 - - (!) 106 18 100 % - -  09/14/18 0400 129/70 - - (!) 110 18 100 % - -  09/14/18 0355 109/68 - - (!) 112 18 100 % - -  09/14/18 0350 121/70 - - (!) 124 18 99 % - -  09/14/18 0345 (!) 142/67 - - (!) 128 18 99 % - -  09/14/18 0340 (!) 147/95 - - Marland Kitchen(!)  105 18 99 % - -  09/14/18 0335 - - - - - 98 % - -  09/14/18 0330 - - - - - 99 % - -  09/14/18 0325 (!) 151/106 - - (!) 106 18 98 % - -  09/14/18 0320 - - - - - 98 % - -  09/14/18 0315 - - - - - 98 % - -  09/14/18 0310 - - - - - 100 % - -  09/14/18 0305 (!) 138/101 - - (!) 112 18 98 % - -  09/14/18 0209 - 98.3 F (36.8 C) Oral - - - - -  09/14/18 0203 - - - - - - 5\' 9"  (1.753 m) 109.3 kg  09/14/18 0137 134/71 - - 90 18 - - -  09/14/18 0045 (!) 141/94 98.4 F (36.9 C) Oral 99 18 100 % - -  09/14/18 0024 (!) 139/96 98.7 F (37.1 C) Oral 99 16 - 5\' 9"  (1.753 m) 112.9 kg     Physical Exam:  General: alert, cooperative, appears stated age and no distress Mood/Affect: flat Lungs: clear to auscultation, no wheezes, rales or rhonchi, symmetric air entry.  Heart: normal rate, regular rhythm, normal S1, S2, no murmurs, rubs, clicks or gallops. Breast: breasts appear normal, no suspicious masses, no skin or nipple changes or axillary nodes. Abdomen:  + bowel sounds, soft, non-tender GU: perineum approximate, healing well. No signs of external hematomas.  Uterine Fundus: firm now @ +1/U Lochia: appropriate Skin: Warm, Dry. DVT Evaluation: No evidence of DVT seen on physical exam. Negative Homan's sign. No  cords or calf tenderness. No significant calf/ankle edema. 2+ Patellar DTR, no clonus.   Bladder scan:  PP QBL: with clots  CBC Latest Ref Rng & Units 09/14/2018 10/23/2010  WBC 4.0 - 10.5 K/uL 10.0 6.4  Hemoglobin 12.0 - 15.0 g/dL 11.8(L) 13.9  Hematocrit 36.0 - 46.0 % 32.5(L) 39.7  Platelets 150 - 400 K/uL 198 238    Results for orders placed or performed during the hospital encounter of 09/13/18 (from the past 24 hour(s))  OB RESULTS CONSOLE ABO/Rh     Status: None   Collection Time: 09/14/18 12:00 AM  Result Value Ref Range   RH Type  Positive    ABO Grouping O   OB RESULT CONSOLE Group B Strep     Status: None   Collection Time: 09/14/18 12:00 AM  Result Value Ref Range   GBS Positive   POCT fern test     Status: None   Collection Time: 09/14/18 12:47 AM  Result Value Ref Range   POCT Fern Test Positive = ruptured amniotic membanes   Type and screen [facility]     Status: None   Collection Time: 09/14/18  1:00 AM  Result Value Ref Range   ABO/RH(D) O POS    Antibody Screen NEG    Sample Expiration      09/17/2018,2359 Performed at [facility], [address] [place], [place] [address]   ABO/Rh     Status: None   Collection Time: 09/14/18  1:10 AM  Result Value Ref Range   ABO/RH(D)      O POS Performed at [facility], [address], [place], [place] [address]   CBC     Status: Abnormal   Collection Time: 09/14/18  1:17 AM  Result Value Ref Range   WBC 10.0 4.0 - 10.5 K/uL   RBC 4.19 3.87 -  5.11 MIL/uL   Hemoglobin 11.8 (L) 12.0 - 15.0 g/dL   HCT 60.4 (L) 54.0 - 98.1 %   MCV 77.6 (L) 80.0 - 100.0 fL   MCH 28.2 26.0 - 34.0 pg   MCHC 36.3 (H) 30.0 - 36.0 g/dL   RDW 19.1 47.8 - 29.5 %   Platelets 198 150 - 400 K/uL   nRBC 0.0 0.0 - 0.2 %  RPR     Status: None   Collection Time: 09/14/18  1:17 AM  Result Value Ref Range   RPR Ser Ql Non Reactive Non Reactive  Comprehensive metabolic panel     Status: Abnormal    Collection Time: 09/14/18  1:17 AM  Result Value Ref Range   Sodium 138 135 - 145 mmol/L   Potassium 4.0 3.5 - 5.1 mmol/L   Chloride 107 98 - 111 mmol/L   CO2 22 22 - 32 mmol/L   Glucose, Bld 88 70 - 99 mg/dL   BUN 6 6 - 20 mg/dL   Creatinine, Ser 6.21 0.44 - 1.00 mg/dL   Calcium 9.4 8.9 - 30.8 mg/dL   Total Protein 6.3 (L) 6.5 - 8.1 g/dL   Albumin 2.6 (L) 3.5 - 5.0 g/dL   AST 25 15 - 41 U/L   ALT 22 0 - 44 U/L   Alkaline Phosphatase 213 (H) 38 - 126 U/L   Total Bilirubin 0.5 0.3 - 1.2 mg/dL   GFR calc non Af Amer >60 >60 mL/min   GFR calc Af Amer >60 >60 mL/min   Anion gap 9 5 - 15  Protein / creatinine ratio, urine     Status: None   Collection Time: 09/14/18  1:17 AM  Result Value Ref Range   Creatinine, Urine 175.70 mg/dL   Total Protein, Urine 17 mg/dL   Protein Creatinine Ratio 0.10 0.00 - 0.15 mg/mg[Cre]  Lactate dehydrogenase     Status: None   Collection Time: 09/14/18  1:17 AM  Result Value Ref Range   LDH 172 98 - 192 U/L  Uric acid     Status: None   Collection Time: 09/14/18  1:17 AM  Result Value Ref Range   Uric Acid, Serum 5.6 2.5 - 7.1 mg/dL     CBG (last 3)  No results for input(s): GLUCAP in the last 72 hours.   I/O last 3 completed shifts: In: -  Out: 1144 [Urine:800; Blood:344]   Assessment Postpartum Day # 0 : S/P NSVD due to AROM with early labor. Pt stable. +1/U involution. Breastfeeding. Hemodynamically stable. Pt uterus was +3 above U when I entered the room, I bladder scan her with no residual urine left post urinating, fentanyl given IM (IV infiltrated, pt declined placement now but consented if increased bleeding is ok with placement if needed) , with PO oxy IR  for pain control prior to PE. clots felt in vaginal vault and cervical opening, fundal massage given with small clots expressed with som blood, PP QBL was 430, therefore total Blood los is now quantitative. Pt asymptomatic.  Cytotec was given rectal, with fundal  massage, U was +1 and palpated deep post massage, but firm. Pt also was dx with GHTN during labor, preeclampsia labs were negative with PCR of 0.10.   Plan: Continue other mgmt as ordered GHTN: Monitor BP, and S/SX Uterine atony: RN to continue fundal massage, urine every 2 hours, call if increased bleeding , continue to have a running total of QBL, if increased bleeding, will  start methergine series.  VTE prophylactics: Early ambulated as tolerates.  Pain control: Motrin/Tylenol PRN Education given regarding options for contraception, including barrier methods, injectable contraception, IUD placement, oral contraceptives.  Breastfeeding, Lactation consult and Contraception unknown, circ to be performed out pt.    Dr. Sallye Ober aware of patient status  [facility] NP-C, CNM 09/14/2018, 9:08 PM   Addendum 10:07 PM  EN called reporting pt was up to the bathroom and passes a large clot, weight 500qbl, pt denies s/sx. No dizziness. Report pt feels better, no abdominal paon, fundus is at +1 above umbilicus. Urine out put was adequate. Ordered stat CBC, and IM methergine 0.2mg  IM, with PO methergine series to follow. RN to call if increased Bleeding occurs again. Total blood loss now 1250qbl from delivery and PP. Dr Sallye Ober aware.

## 2018-09-15 DIAGNOSIS — O9081 Anemia of the puerperium: Secondary | ICD-10-CM | POA: Diagnosis not present

## 2018-09-15 LAB — COMPREHENSIVE METABOLIC PANEL
ALT: 19 U/L (ref 0–44)
AST: 25 U/L (ref 15–41)
Albumin: 1.9 g/dL — ABNORMAL LOW (ref 3.5–5.0)
Alkaline Phosphatase: 146 U/L — ABNORMAL HIGH (ref 38–126)
Anion gap: 10 (ref 5–15)
BUN: 5 mg/dL — ABNORMAL LOW (ref 6–20)
CO2: 23 mmol/L (ref 22–32)
Calcium: 8.3 mg/dL — ABNORMAL LOW (ref 8.9–10.3)
Chloride: 101 mmol/L (ref 98–111)
Creatinine, Ser: 0.77 mg/dL (ref 0.44–1.00)
GFR calc Af Amer: >60 mL/min (ref >60)
GFR calc non Af Amer: >60 mL/min (ref >60)
Glucose, Bld: 80 mg/dL (ref 70–99)
Potassium: 3.8 mmol/L (ref 3.5–5.1)
Sodium: 134 mmol/L — ABNORMAL LOW (ref 135–145)
Total Bilirubin: 0.7 mg/dL (ref 0.3–1.2)
Total Protein: 4.9 g/dL — ABNORMAL LOW (ref 6.5–8.1)

## 2018-09-15 LAB — CBC
HCT: 22.4 % — ABNORMAL LOW (ref 36.0–46.0)
Hemoglobin: 8.2 g/dL — ABNORMAL LOW (ref 12.0–15.0)
MCH: 28.2 pg (ref 26.0–34.0)
MCHC: 36.6 g/dL — ABNORMAL HIGH (ref 30.0–36.0)
MCV: 77 fL — ABNORMAL LOW (ref 80.0–100.0)
Platelets: 155 10*3/uL (ref 150–400)
RBC: 2.91 MIL/uL — ABNORMAL LOW (ref 3.87–5.11)
RDW: 13.3 % (ref 11.5–15.5)
WBC: 16.9 10*3/uL — ABNORMAL HIGH (ref 4.0–10.5)
nRBC: 0 % (ref 0.0–0.2)

## 2018-09-15 NOTE — Progress Notes (Signed)
Subjective: Postpartum Day # 1 : S/P NSVD due to AROM with early labor. Pt delivery consisted of vacuumed assisted with shoulder dystocia and medio-lateral episiotomy.  Patient up ad lib, denies syncope or dizziness. Reports consuming regular diet without issues and denies N/V. Patient reports 0 bowel movement + passing flatus.  Denies issues with urination and reports bleeding is "lighter" Fundal height was reported to be @ U and firm post given meds, last night for PPH.  Patient is breastfeeding and reports going well.  Desires unknown for postpartum contraception.  Pain is being appropriately managed with use of po meds. Pt stable denies syncopy, heart palppitation,denies dizxiness, denies cp or sob. Pt denies HA, RUQ pain or vision changes. RN called about increase in temp post given PP meds to 100.8, was given tylenol and rechecked to be WNL, current temp 97.8.  2nf medio-lateral episiotomy laceration Feeding:  Breast Contraceptive plan:  unknown BB: Circ out pt desired.   Objective: Vital signs in last 24 hours: Patient Vitals for the past 24 hrs:  BP Temp Temp src Pulse Resp SpO2  09/15/18 0443 - 97.8 F (36.6 C) Oral - - -  09/15/18 0341 127/87 (!) 100.8 F (38.2 C) Axillary (!) 101 16 96 %  09/15/18 0300 - 99.7 F (37.6 C) Oral - - -  09/14/18 2330 (!) 136/92 98.3 F (36.8 C) Oral (!) 104 20 96 %  09/14/18 1930 105/65 99.5 F (37.5 C) Oral (!) 121 20 95 %  09/14/18 1845 128/89 99.5 F (37.5 C) Axillary (!) 119 19 -  09/14/18 1802 (!) 133/97 99.9 F (37.7 C) Axillary (!) 120 18 -  09/14/18 1746 (!) 146/93 - - (!) 110 20 -  09/14/18 1731 133/85 - - (!) 114 18 -  09/14/18 1716 (!) 148/101 - - (!) 118 16 -  09/14/18 1701 (!) 126/100 - - - 18 -  09/14/18 1648 (!) 132/95 - - (!) 124 - -  09/14/18 1501 121/66 99.5 F (37.5 C) Oral (!) 104 20 -  09/14/18 1402 (!) 115/58 99.4 F (37.4 C) Oral (!) 105 16 -  09/14/18 1330 120/61 - - 97 18 -  09/14/18 1300 (!) 139/91 99 F (37.2 C)  Oral (!) 114 20 -  09/14/18 1230 121/79 - - 97 20 -  09/14/18 1201 100/62 - - 99 16 -  09/14/18 1131 (!) 107/52 - - 94 16 -  09/14/18 1101 (!) 108/56 98.6 F (37 C) Oral 96 18 -  09/14/18 1030 - - - - 16 -  09/14/18 1002 131/87 - - 97 - -  09/14/18 0930 - 99.1 F (37.3 C) Oral - - -  09/14/18 0901 116/65 - - (!) 106 18 -  09/14/18 0801 103/64 - - (!) 122 16 -  09/14/18 0730 - 98.9 F (37.2 C) Oral - - -  09/14/18 0701 (!) 142/83 - - (!) 103 18 -  09/14/18 0644 - 98.4 F (36.9 C) Oral - - -     Physical Exam:  General: alert, cooperative, appears stated age and no distress Mood/Affect: flat Lungs: clear to auscultation, no wheezes, rales or rhonchi, symmetric air entry.  Heart: normal rate, regular rhythm, normal S1, S2, no murmurs, rubs, clicks or gallops. Breast: breasts appear normal, no suspicious masses, no skin or nipple changes or axillary nodes. Abdomen:  + bowel sounds, soft, non-tender GU: perineum approximate, healing well. No signs of external hematomas.  Uterine Fundus: firm now @ U Lochia: appropriate Skin: Warm,  Dry. DVT Evaluation: No evidence of DVT seen on physical exam. Negative Homan's sign. No cords or calf tenderness. No significant calf/ankle edema. 2+ Patellar DTR, no clonus.   CBC Latest Ref Rng & Units 09/14/2018 09/14/2018 10/23/2010  WBC 4.0 - 10.5 K/uL 18.2(H) 10.0 6.4  Hemoglobin 12.0 - 15.0 g/dL 3.1(V) 11.8(L) 13.9  Hematocrit 36.0 - 46.0 % 24.1(L) 32.5(L) 39.7  Platelets 150 - 400 K/uL 163 198 238    Results for orders placed or performed during the hospital encounter of 09/13/18 (from the past 24 hour(s))  CBC with Differential/Platelet     Status: Abnormal   Collection Time: 09/14/18 10:32 PM  Result Value Ref Range   WBC 18.2 (H) 4.0 - 10.5 K/uL   RBC 3.11 (L) 3.87 - 5.11 MIL/uL   Hemoglobin 8.7 (L) 12.0 - 15.0 g/dL   HCT 40.0 (L) 86.7 - 61.9 %   MCV 77.5 (L) 80.0 - 100.0 fL   MCH 28.0 26.0 - 34.0 pg   MCHC 36.1 (H) 30.0 - 36.0 g/dL    RDW 50.9 32.6 - 71.2 %   Platelets 163 150 - 400 K/uL   nRBC 0.0 0.0 - 0.2 %   Neutrophils Relative % 80 %   Neutro Abs 14.5 (H) 1.7 - 7.7 K/uL   Lymphocytes Relative 9 %   Lymphs Abs 1.7 0.7 - 4.0 K/uL   Monocytes Relative 10 %   Monocytes Absolute 1.8 (H) 0.1 - 1.0 K/uL   Eosinophils Relative 0 %   Eosinophils Absolute 0.0 0.0 - 0.5 K/uL   Basophils Relative 0 %   Basophils Absolute 0.0 0.0 - 0.1 K/uL   Immature Granulocytes 1 %   Abs Immature Granulocytes 0.21 (H) 0.00 - 0.07 K/uL     CBG (last 3)  No results for input(s): GLUCAP in the last 72 hours.   I/O last 3 completed shifts: In: -  Out: 1144 [Urine:800; Blood:344]   Assessment Postpartum Day # 1 : S/P NSVD due to AROM with early labor. Pt stable. +1/U involution. Breastfeeding. Hemodynamically stable. Pt uterus remains @ U Post PPH of QBL total last night, Stat CBC showed HGB @ 8.7 down from starting hgb of 11.8, this morning CBC result hgb of 8.2, pt on iron, asymptomatic except fatigue. Pt received PP Pit, Cytotec, and IM methergine last night, and is on PO methergine series now. Pt also was dx with GHTN during labor, preeclampsia labs were negative with PCR of 0.10. BP this morning stable @ 127/87.  Plan: Continue other mgmt as ordered PP Anemia: Start on PO Iron.  GHTN: Monitor BP, and S/SX, CMP &CBC pending. Uterine atony: Continue PO methergine series.  VTE prophylactics: Early ambulated as tolerates.  Pain control: Motrin/Tylenol PRN Education given regarding options for contraception, including barrier methods, injectable contraception, IUD placement, oral contraceptives.  Breastfeeding, Lactation consult and Contraception unknown, circ to be performed out pt.    Dr. Estanislado Pandy to be updated on pt status @ 0700 and assume care of pt.   [facility] NP-C, CNM 09/15/2018, 6:17 AM

## 2018-09-15 NOTE — Lactation Note (Signed)
This note was copied from a baby's chart. Lactation Consultation Note:  Infant is 66 hours old and has had 2 feeding that mother reports that she felt strong tugging.  Mother reports that infant is sleepy and has not waking to eat well.   Attempt to latch infant and infant is sleepy. Mother taught to do suck training. Mother offered to assist with hand expression. Infant was fed 4-5 ml of ebm with a spoon.   Infant latched on the left breast for 10-15 mins. In football hold. Infant then latched on the left breast. Infant sustained latch for more than 20 mins. Mother taught to do breast compression. Observed infant suckling and swallows. Father taught to assist with latching infant and giving mother support.   Lots to teaching with latch technique. Mother receptive to all teaching.  Suggested to cue base feed and to feed infant 8-12 times or more. Discussed cluster feeding and the second night of life.   Suggested that mother page for staff nurse to assist with latch and observed for swallows.  Parents appreciative of all teaching.  Mother to follow up with [facility] services as needed.     Patient Name: Boy [place] Fennewald Today's Date: 09/15/2018 Reason for consult: Follow-up assessment Type of Endocrine Disorder?: PCOS   Maternal Data    Feeding Feeding Type: Breast Fed  LATCH Score Latch: Grasps breast easily, tongue down, lips flanged, rhythmical sucking.(tea-cup hold used to latch infant)  Audible Swallowing: Spontaneous and intermittent  Type of Nipple: Everted at rest and after stimulation  Comfort (Breast/Nipple): Soft / non-tender  Hold (Positioning): Assistance needed to correctly position infant at breast and maintain latch.  LATCH Score: 9  Interventions Interventions: Assisted with latch;Skin to skin;Breast massage;Hand express;Breast compression;Adjust position;Support pillows;Position options;Expressed milk;Comfort gels;DEBP  Lactation Tools Discussed/Used      Consult Status Consult Status: Follow-up Date: 09/16/18 Follow-up type: In-patient    Stevan Born [facility] 09/15/2018, 3:23 PM

## 2018-09-15 NOTE — Anesthesia Postprocedure Evaluation (Signed)
Anesthesia Post Note  Patient: [name]  Procedure(s) Performed: AN AD HOC LABOR EPIDURAL     Patient location during evaluation: Mother Baby Anesthesia Type: Epidural Level of consciousness: awake and alert Pain management: pain level controlled Vital Signs Assessment: post-procedure vital signs reviewed and stable Respiratory status: spontaneous breathing, nonlabored ventilation and respiratory function stable Cardiovascular status: stable Postop Assessment: no headache, no backache, epidural receding, no apparent nausea or vomiting, patient able to bend at knees, able to ambulate and adequate PO intake Anesthetic complications: no    Last Vitals:  Vitals:   09/15/18 0341 09/15/18 0443  BP: 127/87   Pulse: (!) 101   Resp: 16   Temp: (!) 38.2 C 36.6 C  SpO2: 96%     Last Pain:  Vitals:   09/15/18 0443  TempSrc: Oral  PainSc:    Pain Goal:                   Laban Emperor

## 2018-09-16 MED ORDER — IBUPROFEN 600 MG PO TABS: 600.0000 mg | ORAL_TABLET | Freq: Four times a day (QID) | ORAL | 0 refills | Status: DC | PRN

## 2018-09-16 MED ORDER — OXYCODONE-ACETAMINOPHEN 5-325 MG PO TABS: 1.0000 | ORAL_TABLET | Freq: Four times a day (QID) | ORAL | 0 refills | Status: AC | PRN

## 2018-09-16 MED ORDER — FERROUS SULFATE 325 (65 FE) MG PO TABS: 325.0000 mg | ORAL_TABLET | Freq: Two times a day (BID) | ORAL | 2 refills | Status: DC

## 2018-09-16 NOTE — Lactation Note (Signed)
This note was copied from a baby's chart. Lactation Consultation Note Hand expressed 1 ml colostrum while baby BF on opposite breast. Noted significant softening of breast baby feed on. LC didn't hear a lot of swallows. Occasional faint swallow. Encouraged mom to massage breast during feedings. LC feels that baby isn't getting deep latch although mom's nipples have no break down or soreness.  Nipples flat. Shells given to evert. Mom finger stimulates before latches, but flatten quickly.  Nipples compressible.  Encouraged mom to pump d/t PCOS.  Please see LC again before d/c home.  Patient Name: [name] Today's Date: 09/16/2018 Reason for consult: Follow-up assessment;1st time breastfeeding Type of Endocrine Disorder?: PCOS   Maternal Data    Feeding Feeding Type: Breast Fed  LATCH Score Latch: Grasps breast easily, tongue down, lips flanged, rhythmical sucking.  Audible Swallowing: A few with stimulation  Type of Nipple: Everted at rest and after stimulation  Comfort (Breast/Nipple): Soft / non-tender  Hold (Positioning): Assistance needed to correctly position infant at breast and maintain latch.  LATCH Score: 8  Interventions Interventions: Breast feeding basics reviewed;Adjust position;Support pillows;Skin to skin;Position options;Assisted with latch;Breast massage;Hand express;Pre-pump if needed;Shells;Breast compression  Lactation Tools Discussed/Used Tools: Shells;Pump Shell Type: Inverted   Consult Status Consult Status: Follow-up Date: 09/16/18 Follow-up type: In-patient    Charyl Dancer 09/16/2018, 4:26 AM

## 2018-09-16 NOTE — Lactation Note (Addendum)
This note was copied from a baby's chart. Lactation Consultation Note  Patient Name: [name] Today's Date: 09/16/2018   Parents were shown how to do the "teacup" hold & 2 different ways to do breast compressions. "MJ" did very well with frequent swallows when this was done (swallows verified by cervical auscultation).   Pumping was discussed (hold off for 2 weeks unless there are issues with baby's feedings or engorgement). Ways to wake a sleepy baby were also discussed.  Lurline [facility] 09/16/2018, 11:58 AM

## 2018-09-16 NOTE — Lactation Note (Signed)
This note was copied from a baby's chart. Lactation Consultation Note Baby 35 hrs old. Lips dry. Oral mucosa appears to start not looking very moist.  Baby has thick labial frenulum as well as visual frenulum. Has movement of tongue.  Noted when baby feeding top lip pulling down. LC flanged upper lip. Repositioned baby. Discussed feeding positions. Encouraged breast massage... Baby had pacifier in mouth. Discouraged for 2 weeks. Encouraged mom to assess for transfer of milk after BF. LC concerned of transfer. Will monitor for wt. Loss. Needs to be seen again before d/c.  Patient Name: [name] Today's Date: 09/16/2018 Reason for consult: Follow-up assessment;1st time breastfeeding Type of Endocrine Disorder?: PCOS   Maternal Data    Feeding Feeding Type: Breast Fed  LATCH Score Latch: Grasps breast easily, tongue down, lips flanged, rhythmical sucking.  Audible Swallowing: A few with stimulation  Type of Nipple: Everted at rest and after stimulation  Comfort (Breast/Nipple): Soft / non-tender  Hold (Positioning): Assistance needed to correctly position infant at breast and maintain latch.  LATCH Score: 8  Interventions Interventions: Breast feeding basics reviewed;Adjust position;Support pillows;Skin to skin;Position options;Assisted with latch;Breast massage;Hand express;Pre-pump if needed;Shells;Breast compression  Lactation Tools Discussed/Used Tools: Shells;Pump Shell Type: Inverted   Consult Status Consult Status: Follow-up Date: 09/16/18 Follow-up type: In-patient    Sanyia Dini, Diamond Nickel 09/16/2018, 4:01 AM

## 2018-09-16 NOTE — Discharge Instructions (Signed)
Postpartum Care After Vaginal Delivery ° °The period of time right after you deliver your newborn is called the postpartum period. °What kind of medical care will I receive? °· You may continue to receive fluids and medicines through an IV tube inserted into one of your veins. °· If an incision was made near your vagina (episiotomy) or if you had some vaginal tearing during delivery, cold compresses may be placed on your episiotomy or your tear. This helps to reduce pain and swelling. °· You may be given a squirt bottle to use when you go to the bathroom. You may use this until you are comfortable wiping as usual. To use the squirt bottle, follow these steps: °? Before you urinate, fill the squirt bottle with warm water. Do not use hot water. °? After you urinate, while you are sitting on the toilet, use the squirt bottle to rinse the area around your urethra and vaginal opening. This rinses away any urine and blood. °? You may do this instead of wiping. As you start healing, you may use the squirt bottle before wiping yourself. Make sure to wipe gently. °? Fill the squirt bottle with clean water every time you use the bathroom. °· You will be given sanitary pads to wear. °How can I expect to feel? °· You may not feel the need to urinate for several hours after delivery. °· You will have some soreness and pain in your abdomen and vagina. °· If you are breastfeeding, you may have uterine contractions every time you breastfeed for up to several weeks postpartum. Uterine contractions help your uterus return to its normal size. °· It is normal to have vaginal bleeding (lochia) after delivery. The amount and appearance of lochia is often similar to a menstrual period in the first week after delivery. It will gradually decrease over the next few weeks to a dry, yellow-brown discharge. For most women, lochia stops completely by 6-8 weeks after delivery. Vaginal bleeding can vary from woman to woman. °· Within the first few  days after delivery, you may have breast engorgement. This is when your breasts feel heavy, full, and uncomfortable. Your breasts may also throb and feel hard, tightly stretched, warm, and tender. After this occurs, you may have milk leaking from your breasts. Your health care provider can help you relieve discomfort due to breast engorgement. Breast engorgement should go away within a few days. °· You may feel more sad or worried than normal due to hormonal changes after delivery. These feelings should not last more than a few days. If these feelings do not go away after several days, speak with your health care provider. °How should I care for myself? °· Tell your health care provider if you have pain or discomfort. °· Drink enough water to keep your urine clear or pale yellow. °· Wash your hands thoroughly with soap and water for at least 20 seconds after changing your sanitary pads, after using the toilet, and before holding or feeding your baby. °· If you are not breastfeeding, avoid touching your breasts a lot. Doing this can make your breasts produce more milk. °· If you become weak or lightheaded, or you feel like you might faint, ask for help before: °? Getting out of bed. °? Showering. °· Change your sanitary pads frequently. Watch for any changes in your flow, such as a sudden increase in volume, a change in color, the passing of large blood clots. If you pass a blood clot from your vagina,   save it to show to your health care provider. Do not flush blood clots down the toilet without having your health care provider look at them. °· Make sure that all your vaccinations are up to date. This can help protect you and your baby from getting certain diseases. You may need to have immunizations done before you leave the hospital. °· If desired, talk with your health care provider about methods of family planning or birth control (contraception). °How can I start bonding with my baby? °Spending as much time as  possible with your baby is very important. During this time, you and your baby can get to know each other and develop a bond. Having your baby stay with you in your room (rooming in) can give you time to get to know your baby. Rooming in can also help you become comfortable caring for your baby. Breastfeeding can also help you bond with your baby. °How can I plan for returning home with my baby? °· Make sure that you have a car seat installed in your vehicle. °? Your car seat should be checked by a certified car seat installer to make sure that it is installed safely. °? Make sure that your baby fits into the car seat safely. °· Ask your health care provider any questions you have about caring for yourself or your baby. Make sure that you are able to contact your health care provider with any questions after leaving the hospital. °This information is not intended to replace advice given to you by your health care provider. Make sure you discuss any questions you have with your health care provider. °Document Released: 02/22/2007 Document Revised: 09/30/2015 Document Reviewed: 04/01/2015 °Elsevier Interactive Patient Education © 2018 [employer] ° ° °Postpartum Depression and Baby Blues °The postpartum period begins right after the birth of a baby. During this time, there is often a great amount of joy and excitement. It is also a time of many changes in the life of the parents. Regardless of how many times a mother gives birth, each child brings new challenges and dynamics to the family. It is not unusual to have feelings of excitement along with confusing shifts in moods, emotions, and thoughts. All mothers are at risk of developing postpartum depression or the "baby blues." These mood changes can occur right after giving birth, or they may occur many months after giving birth. The baby blues or postpartum depression can be mild or severe. Additionally, postpartum depression can go away rather quickly, or it can  be a long-term condition. °What are the causes? °Raised hormone levels and the rapid drop in those levels are thought to be a main cause of postpartum depression and the baby blues. A number of hormones change during and after pregnancy. Estrogen and progesterone usually decrease right after the delivery of your baby. The levels of thyroid hormone and various cortisol steroids also rapidly drop. Other factors that play a role in these mood changes include major life events and genetics. °What increases the risk? °If you have any of the following risks for the baby blues or postpartum depression, know what symptoms to watch out for during the postpartum period. Risk factors that may increase the likelihood of getting the baby blues or postpartum depression include: °· Having a personal or family history of depression. °· Having depression while being pregnant. °· Having premenstrual mood issues or mood issues related to oral contraceptives. °· Having a lot of life stress. °· Having marital conflict. °· Lacking   a social support network. °· Having a baby with special needs. °· Having health problems, such as diabetes. ° °What are the signs or symptoms? °Symptoms of baby blues include: °· Brief changes in mood, such as going from extreme happiness to sadness. °· Decreased concentration. °· Difficulty sleeping. °· Crying spells, tearfulness. °· Irritability. °· Anxiety. ° °Symptoms of postpartum depression typically begin within the first month after giving birth. These symptoms include: °· Difficulty sleeping or excessive sleepiness. °· Marked weight loss. °· Agitation. °· Feelings of worthlessness. °· Lack of interest in activity or food. ° °Postpartum psychosis is a very serious condition and can be dangerous. Fortunately, it is rare. Displaying any of the following symptoms is cause for immediate medical attention. Symptoms of postpartum psychosis include: °· Hallucinations and delusions. °· Bizarre or disorganized  behavior. °· Confusion or disorientation. ° °How is this diagnosed? °A diagnosis is made by an evaluation of your symptoms. There are no medical or lab tests that lead to a diagnosis, but there are various questionnaires that a health care provider may use to identify those with the baby blues, postpartum depression, or psychosis. Often, a screening tool called the Edinburgh Postnatal Depression Scale is used to diagnose depression in the postpartum period. °How is this treated? °The baby blues usually goes away on its own in 1-2 weeks. Social support is often all that is needed. You will be encouraged to get adequate sleep and rest. Occasionally, you may be given medicines to help you sleep. °Postpartum depression requires treatment because it can last several months or longer if it is not treated. Treatment may include individual or group therapy, medicine, or both to address any social, physiological, and psychological factors that may play a role in the depression. Regular exercise, a healthy diet, rest, and social support may also be strongly recommended. °Postpartum psychosis is more serious and needs treatment right away. Hospitalization is often needed. °Follow these instructions at home: °· Get as much rest as you can. Nap when the baby sleeps. °· Exercise regularly. Some women find yoga and walking to be beneficial. °· Eat a balanced and nourishing diet. °· Do little things that you enjoy. Have a cup of tea, take a bubble bath, read your favorite magazine, or listen to your favorite music. °· Avoid alcohol. °· Ask for help with household chores, cooking, grocery shopping, or running errands as needed. Do not try to do everything. °· Talk to people close to you about how you are feeling. Get support from your partner, family members, friends, or other new moms. °· Try to stay positive in how you think. Think about the things you are grateful for. °· Do not spend a lot of time alone. °· Only take  over-the-counter or prescription medicine as directed by your health care provider. °· Keep all your postpartum appointments. °· Let your health care provider know if you have any concerns. °Contact a health care provider if: °You are having a reaction to or problems with your medicine. °Get help right away if: °· You have suicidal feelings. °· You think you may harm the baby or someone else. °This information is not intended to replace advice given to you by your health care provider. Make sure you discuss any questions you have with your health care provider. °Document Released: 01/30/2004 Document Revised: 10/03/2015 Document Reviewed: 02/06/2013 °Elsevier Interactive Patient Education © 2017 [employer] ° ° °Preeclampsia and Eclampsia ° °Preeclampsia is a serious condition that may develop during pregnancy. It   is also called toxemia of pregnancy. This condition causes high blood pressure along with other symptoms, such as swelling and headaches. These symptoms may develop as the condition gets worse. Preeclampsia may occur at 20 weeks of pregnancy or later. °Diagnosing and treating preeclampsia early is very important. If not treated early, it can cause serious problems for you and your baby. One problem it can lead to is eclampsia. Eclampsia is a condition that causes muscle jerking or shaking (convulsions or seizures) and other serious problems for the mother. During pregnancy, delivering your baby may be the best treatment for preeclampsia or eclampsia. For most women, preeclampsia and eclampsia symptoms go away after giving birth. °In rare cases, a woman may develop preeclampsia after giving birth (postpartum preeclampsia). This usually occurs within 48 hours after childbirth but may occur up to 6 weeks after giving birth. °What are the causes? °The cause of preeclampsia is not known. °What increases the risk? °The following risk factors make you more likely to develop preeclampsia: °· Being pregnant for  the first time. °· Having had preeclampsia during a past pregnancy. °· Having a family history of preeclampsia. °· Having high blood pressure. °· Being pregnant with more than one baby. °· Being 35 or older. °· Being African-American. °· Having kidney disease or diabetes. °· Having medical conditions such as lupus or blood diseases. °· Being very overweight (obese). °What are the signs or symptoms? °The earliest signs of preeclampsia are: °· High blood pressure. °· Increased protein in your urine. Your health care provider will check for this at every visit before you give birth (prenatal visit). °Other symptoms that may develop as the condition gets worse include: °· Severe headaches. °· Sudden weight gain. °· Swelling of the hands, face, legs, and feet. °· Nausea and vomiting. °· Vision problems, such as blurred or double vision. °· Numbness in the face, arms, legs, and feet. °· Urinating less than usual. °· Dizziness. °· Slurred speech. °· Abdominal pain, especially upper abdominal pain. °· Convulsions or seizures. °How is this diagnosed? °There are no screening tests for preeclampsia. Your health care provider will ask you about symptoms and check for signs of preeclampsia during your prenatal visits. You may also have tests that include: °· Urine tests. °· Blood tests. °· Checking your blood pressure. °· Monitoring your baby’s heart rate. °· Ultrasound. °How is this treated? °You and your health care provider will determine the treatment approach that is best for you. Treatment may include: °· Having more frequent prenatal exams to check for signs of preeclampsia, if you have an increased risk for preeclampsia. °· Medicine to lower your blood pressure. °· Staying in the hospital, if your condition is severe. There, treatment will focus on controlling your blood pressure and the amount of fluids in your body (fluid retention). °· Taking medicine (magnesium sulfate) to prevent seizures. This may be given as an  injection or through an IV. °· Taking a low-dose aspirin during your pregnancy. °· Delivering your baby early, if your condition gets worse. You may have your labor started with medicine (induced), or you may have a cesarean delivery. °Follow these instructions at home: °Eating and drinking ° °· Drink enough fluid to keep your urine pale yellow. °· Avoid caffeine. °Lifestyle °· Do not use any products that contain nicotine or tobacco, such as cigarettes and e-cigarettes. If you need help quitting, ask your health care provider. °· Do not use alcohol or drugs. °· Avoid stress as much as possible. Rest   and get plenty of sleep. °General instructions °· Take over-the-counter and prescription medicines only as told by your health care provider. °· When lying down, lie on your left side. This keeps pressure off your major blood vessels. °· When sitting or lying down, raise (elevate) your feet. Try putting some pillows underneath your lower legs. °· Exercise regularly. Ask your health care provider what kinds of exercise are best for you. °· Keep all follow-up and prenatal visits as told by your health care provider. This is important. °How is this prevented? °There is no known way of preventing preeclampsia or eclampsia from developing. However, to lower your risk of complications and detect problems early: °· Get regular prenatal care. Your health care provider may be able to diagnose and treat the condition early. °· Maintain a healthy weight. Ask your health care provider for help managing weight gain during pregnancy. °· Work with your health care provider to manage any long-term (chronic) health conditions you have, such as diabetes or kidney problems. °· You may have tests of your blood pressure and kidney function after giving birth. °· Your health care provider may have you take low-dose aspirin during your next pregnancy. °Contact a health care provider if: °· You have symptoms that your health care provider told  you may require more treatment or monitoring, such as: °? Headaches. °? Nausea or vomiting. °? Abdominal pain. °? Dizziness. °? Light-headedness. °Get help right away if: °· You have severe: °? Abdominal pain. °? Headaches that do not get better. °? Dizziness. °? Vision problems. °? Confusion. °? Nausea or vomiting. °· You have any of the following: °? A seizure. °? Sudden, rapid weight gain. °? Sudden swelling in your hands, ankles, or face. °? Trouble moving any part of your body. °? Numbness in any part of your body. °? Trouble speaking. °? Abnormal bleeding. °· You faint. °Summary °· Preeclampsia is a serious condition that may develop during pregnancy. It is also called toxemia of pregnancy. °· This condition causes high blood pressure along with other symptoms, such as swelling and headaches. °· Diagnosing and treating preeclampsia early is very important. If not treated early, it can cause serious problems for you and your baby. °· Get help right away if you have symptoms that your health care provider told you to watch for. °This information is not intended to replace advice given to you by your health care provider. Make sure you discuss any questions you have with your health care provider. °Document Released: 04/24/2000 Document Revised: 04/13/2017 Document Reviewed: 12/02/2015 °Elsevier Interactive Patient Education © 2019 [employer] ° °

## 2018-09-16 NOTE — Discharge Summary (Signed)
OB Discharge Summary     Patient Name: [name] DOB: 03/31/1996 MRN: [id number]  Date of admission: 09/13/2018 Delivering MD: Essie HartPINN, WALDA   Date of discharge: 09/16/2018  Admitting diagnosis: 38WKS WATER BROKE Intrauterine pregnancy: 2853w0d     Secondary diagnosis:  Active Problems:   Normal labor   Gestational hypertension   Shoulder dystocia during labor and delivery   Delivery of first pregnancy by vacuum extraction   Postpartum hemorrhage   Postpartum anemia     Discharge diagnosis: Term Pregnancy Delivered, Gestational Hypertension and Anemia                                                                                                Post partum procedures:n/a  Augmentation: n/a  Complications: None  Hospital course:  Onset of Labor With Vaginal Delivery     23 y.o. yo G1P1001 at 1653w0d was admitted in Latent Labor on 09/13/2018. Patient had an uncomplicated labor course as follows:  Membrane Rupture Time/Date: 11:45 PM ,09/13/2018   Intrapartum Procedures: Episiotomy: Right Mediolateral [4]                                         Lacerations:  2nd degree [3];Perineal [11]  Patient had a delivery of a Viable infant. 09/14/2018  Information for the patient's newborn:  Wilson SingerWarner, Boy Catherine [[id number]][[id number]]  Delivery Method: Vaginal, Vacuum (Extractor)(Filed from Delivery Summary)    Pateint had an uncomplicated postpartum course.  She is ambulating, tolerating a regular diet, passing flatus, and urinating well. Patient is discharged home in stable condition on 09/16/18.   Physical exam  Vitals:   09/15/18 0732 09/15/18 0917 09/15/18 2100 09/16/18 0609  BP: 122/75  124/67 103/60  Pulse: 93  81 91  Resp: 18   18  Temp: 98.3 F (36.8 C) 99 F (37.2 C) 98.2 F (36.8 C) 98.8 F (37.1 C)  TempSrc: Oral Oral Oral Oral  SpO2:      Weight:      Height:       General: alert, cooperative and no distress Lochia: appropriate Uterine Fundus: firm Incision: N/A DVT Evaluation:  No evidence of DVT seen on physical exam. Negative Homan's sign. No cords or calf tenderness. Gestational hypertension: Patient denies symptoms of preeclampsia: headache, vision changes, RUQ pain. Consulted Dr. Su Hiltoberts, per Dr. Su Hiltoberts patient stable for discharge with a follow-up visit in 1 week for blood pressure check in the office. Patient to call or present to MAU with symptoms of preeclampsia.   Labs: Lab Results  Component Value Date   WBC 16.9 (H) 09/15/2018   HGB 8.2 (L) 09/15/2018   HCT 22.4 (L) 09/15/2018   MCV 77.0 (L) 09/15/2018   PLT 155 09/15/2018   CMP Latest Ref Rng & Units 09/15/2018  Glucose 70 - 99 mg/dL 80  BUN 6 - 20 mg/dL 5(L)  Creatinine 7.820.44 - 1.00 mg/dL 9.560.77  Sodium 213135 - 086145 mmol/L 134(L)  Potassium 3.5 - 5.1 mmol/L 3.8  Chloride 98 -  111 mmol/L 101  CO2 22 - 32 mmol/L 23  Calcium 8.9 - 10.3 mg/dL 8.3(L)  Total Protein 6.5 - 8.1 g/dL 4.9(L)  Total Bilirubin 0.3 - 1.2 mg/dL 0.7  Alkaline Phos 38 - 126 U/L 146(H)  AST 15 - 41 U/L 25  ALT 0 - 44 U/L 19    Discharge instruction: per After Visit Summary and "Baby and Me Booklet". Preeclampsia precautions reviewed and handout given.  After visit meds:  Allergies as of 09/16/2018   No Known Allergies     Medication List    TAKE these medications   cetirizine 10 MG tablet Commonly known as:  ZYRTEC Take 10 mg by mouth daily.   ferrous sulfate 325 (65 FE) MG tablet Take 1 tablet (325 mg total) by mouth 2 (two) times daily with a meal.   ibuprofen 600 MG tablet Commonly known as:  ADVIL Take 1 tablet (600 mg total) by mouth every 6 (six) hours as needed for moderate pain.   oxyCODONE-acetaminophen 5-325 MG tablet Commonly known as:  PERCOCET/ROXICET Take 1 tablet by mouth every 6 (six) hours as needed for up to 7 days for severe pain.   prenatal vitamin w/FE, FA 27-1 MG Tabs tablet Take 1 tablet by mouth daily at 12 noon.       Diet: routine diet, PO Iron BID for mild asymptomatic anemia    Activity: Advance as tolerated. Pelvic rest for 6 weeks.   Outpatient follow up:1 week for blood pressure check, 6 weeks for postpartum visit Follow up Appt:No future appointments. Follow up Visit:No follow-ups on file.  Postpartum contraception: Undecided  Newborn Data: Live born female  Birth Weight: 7 lb 2.1 oz (3235 g) APGAR: 2, 9  Newborn Delivery   Birth date/time:  09/14/2018 16:38:00 Delivery type:  Vaginal, Vacuum (Extractor)     Baby Feeding: Breast Disposition:home with mother   09/16/2018 Janeece Riggers, CNM

## 2018-09-17 ENCOUNTER — Ambulatory Visit: Payer: Self-pay

## 2018-09-17 NOTE — Lactation Note (Signed)
This note was copied from a baby's chart. Lactation Consultation Note  Patient Name: [name] Today's Date: 09/17/2018   Infant is 52 hours old. Mom asked for assistance with pumping. She says her breasts "feel a lot fuller," but her breasts still palpate as soft. Pacifier noted in infant's mouth b/c "he was whining." I asked Mom to assume hunger & feed before offering a pacifier. Mom was observed pumping & size 24 flanges are appropriate for her at this time. Hand expression was taught to Mom. After pumping & hand expression, the total amount obtained was 1 mL (put into a colostrum vial).   "MJ" went to breast & I noticed that he was not doing as well today as yesterday, even with breast compressions. He latches vigorously, but then soon falls asleep, most likely due to lack of flow. I noted that MJ's lips were dry. Mom was amenable to supplementing at breast, but he only took 4 mL. His diaper was changed after feeding at the breast & it was noted that there was a small amount of concentrated urine & with urates. B/c of the concentrated urine, dry lips, and less efficiency at the breast than yesterday, I asked Mom if she would allow me to give a bottle. She said she would.  "MJ" was fed with a slow-flow Similac nipple. Paced feeding was demonstrated to Mom. He was fed until satiated (about 15 mL).  Plan: 1. Offer breast (if desired) 2. Offer bottle after each breast feeding 3. Pump whenever infant receives formula  Lurline Hare [facility] 09/17/2018, 11:41 AM

## 2019-05-12 NOTE — L&D Delivery Note (Signed)
Delivery Note:   [id number] at [redacted]w[redacted]d  Admitting diagnosis: Polyhydramnios [O40.9XX0] Risks: Polyhydramnios, history of shoulder dystocia and PPH Onset of labor: 01/14/20 @ 1012 IOL/Augmentation: AROM, Pitocin, Cytotec and IP Foley ROM: 01/14/2020 @ 1600  Complete dilation at 01/14/2020  2219 Onset of pushing at 2220 FHR second stage Cat II  Analgesia /Anesthesia intrapartum:Epidural  Pushing in right side lying position with CNM and L&D staff support, FOB and patient's mother present for birth and supportive.  Delivery of a Live born female  Birth Weight: 8 lb 2.9 oz (3710 g) APGAR: 5, 9  Newborn Delivery   Birth date/time: 01/14/2020 22:44:00 Delivery type: Vaginal, Spontaneous     in cephalic presentation, position OA to LOA.  APGAR:1 min-5 , 5 min-9  10 min-9  Nuchal Cord: Yes x1 Cord double clamped after cessation of pulsation, cut by FOB.  Collection of cord blood for typing completed. Cord blood donation-None  Arterial cord blood sample-No    Placenta delivered-Spontaneous  with 3 vessels . Uterotonics: Pitocin, Methergine x 2, Cytotec, Hemabate, TXA  Placenta deliver spontaneously. No lacerations noted. Small trickle noted with uterine atony. Methergine given. Trickle continued. Manual exploration of the uterus performed with lower uterine segment atony and expression of several large clots. Cytotec 800mg  given. Repeat uterine sweep after 2 minutes with continued trickling and clots build-up noted. Continued fundal massage. Pt medicated with Fentanyl x 2, 10 minutes apart. TXA added 1 gram and Hemabate given along with Lomotil. Uterine atony persisted despite measures. Dr. paged to come for bedside consult and in route. Code hemorrhage called. Bakri balloon attempt initiated. Dr. Charlotta Newton arrived to room and continued Bakri balloon placement attempt. Anesthesia at the bedside and medicating patient for comfort. Stat labs ordered, CBC and DIC panel now. Will repeat in 6 hours.  Will give 2 liters LR for volume replacement for a total fluid volume of 2500ccs. Please see Dr. Charlotta Newton note for assumed care.   Placenta to L&D Uterine tone firm bleeding stable  None  laceration identified.  Episiotomy:None  Local analgesia: None  Repair: N/A Est. Blood Loss (mL):2,500.00   Complications: Hemorrhage>1088mL  Mom to [facility].  Baby to Couplet care.  Delivery Report:   Review the Delivery Report for details.    Signed: [facility], MSN 01/15/2020, 12:17 AM

## 2019-06-28 LAB — OB RESULTS CONSOLE ABO/RH: RH Type: POSITIVE

## 2019-06-28 LAB — OB RESULTS CONSOLE HEPATITIS B SURFACE ANTIGEN: Hepatitis B Surface Ag: NEGATIVE

## 2019-06-28 LAB — OB RESULTS CONSOLE RUBELLA ANTIBODY, IGM: Rubella: IMMUNE

## 2019-06-28 LAB — OB RESULTS CONSOLE HIV ANTIBODY (ROUTINE TESTING): HIV: NONREACTIVE

## 2019-06-28 LAB — OB RESULTS CONSOLE RPR: RPR: NONREACTIVE

## 2019-06-28 LAB — OB RESULTS CONSOLE ANTIBODY SCREEN: Antibody Screen: NEGATIVE

## 2019-08-07 ENCOUNTER — Other Ambulatory Visit (HOSPITAL_COMMUNITY): Payer: Self-pay | Admitting: Obstetrics and Gynecology

## 2019-08-07 DIAGNOSIS — Z3689 Encounter for other specified antenatal screening: Secondary | ICD-10-CM

## 2019-08-07 DIAGNOSIS — Z3A20 20 weeks gestation of pregnancy: Secondary | ICD-10-CM

## 2019-09-06 ENCOUNTER — Encounter (HOSPITAL_COMMUNITY): Payer: Self-pay

## 2019-09-06 ENCOUNTER — Other Ambulatory Visit (HOSPITAL_COMMUNITY): Payer: Self-pay | Admitting: Obstetrics and Gynecology

## 2019-09-06 ENCOUNTER — Other Ambulatory Visit: Payer: Self-pay

## 2019-09-06 ENCOUNTER — Other Ambulatory Visit (HOSPITAL_COMMUNITY): Payer: Self-pay | Admitting: *Deleted

## 2019-09-06 ENCOUNTER — Ambulatory Visit (HOSPITAL_COMMUNITY)
Admission: RE | Admit: 2019-09-06 | Discharge: 2019-09-06 | Disposition: A | Discharge status: Home / Self Care | Payer: Medicaid Other | Source: Ambulatory Visit | Attending: Obstetrics and Gynecology | Admitting: Obstetrics and Gynecology

## 2019-09-06 ENCOUNTER — Encounter (HOSPITAL_COMMUNITY): Payer: Self-pay | Admitting: Obstetrics and Gynecology

## 2019-09-06 DIAGNOSIS — Z3A20 20 weeks gestation of pregnancy: Secondary | ICD-10-CM | POA: Insufficient documentation

## 2019-09-06 DIAGNOSIS — Z3689 Encounter for other specified antenatal screening: Secondary | ICD-10-CM

## 2019-09-06 DIAGNOSIS — Z362 Encounter for other antenatal screening follow-up: Secondary | ICD-10-CM

## 2019-09-06 DIAGNOSIS — Z363 Encounter for antenatal screening for malformations: Secondary | ICD-10-CM

## 2019-10-04 ENCOUNTER — Other Ambulatory Visit: Payer: Self-pay

## 2019-10-04 ENCOUNTER — Ambulatory Visit (HOSPITAL_COMMUNITY): Payer: Medicaid Other | Attending: Maternal & Fetal Medicine

## 2019-10-04 ENCOUNTER — Encounter: Payer: Self-pay | Admitting: *Deleted

## 2019-10-04 ENCOUNTER — Encounter (INDEPENDENT_AMBULATORY_CARE_PROVIDER_SITE_OTHER): Payer: Self-pay

## 2019-10-04 ENCOUNTER — Other Ambulatory Visit: Payer: Self-pay | Admitting: *Deleted

## 2019-10-04 DIAGNOSIS — O09292 Supervision of pregnancy with other poor reproductive or obstetric history, second trimester: Secondary | ICD-10-CM | POA: Diagnosis not present

## 2019-10-04 DIAGNOSIS — O09892 Supervision of other high risk pregnancies, second trimester: Secondary | ICD-10-CM | POA: Diagnosis not present

## 2019-10-04 DIAGNOSIS — Z8759 Personal history of other complications of pregnancy, childbirth and the puerperium: Secondary | ICD-10-CM

## 2019-10-04 DIAGNOSIS — Z3A24 24 weeks gestation of pregnancy: Secondary | ICD-10-CM

## 2019-10-04 DIAGNOSIS — Z362 Encounter for other antenatal screening follow-up: Secondary | ICD-10-CM | POA: Insufficient documentation

## 2019-10-04 DIAGNOSIS — O99012 Anemia complicating pregnancy, second trimester: Secondary | ICD-10-CM

## 2019-10-04 DIAGNOSIS — D649 Anemia, unspecified: Secondary | ICD-10-CM

## 2019-10-04 DIAGNOSIS — E669 Obesity, unspecified: Secondary | ICD-10-CM

## 2019-10-04 DIAGNOSIS — O99212 Obesity complicating pregnancy, second trimester: Secondary | ICD-10-CM

## 2019-11-08 ENCOUNTER — Other Ambulatory Visit: Payer: Self-pay | Admitting: *Deleted

## 2019-11-08 ENCOUNTER — Ambulatory Visit: Payer: Medicaid Other | Attending: Obstetrics and Gynecology

## 2019-11-08 ENCOUNTER — Other Ambulatory Visit: Payer: Self-pay

## 2019-11-08 ENCOUNTER — Ambulatory Visit: Payer: Medicaid Other | Admitting: *Deleted

## 2019-11-08 VITALS — BP 119/83 | HR 108

## 2019-11-08 DIAGNOSIS — O09293 Supervision of pregnancy with other poor reproductive or obstetric history, third trimester: Secondary | ICD-10-CM | POA: Diagnosis not present

## 2019-11-08 DIAGNOSIS — O09893 Supervision of other high risk pregnancies, third trimester: Secondary | ICD-10-CM | POA: Diagnosis not present

## 2019-11-08 DIAGNOSIS — E669 Obesity, unspecified: Secondary | ICD-10-CM

## 2019-11-08 DIAGNOSIS — Z362 Encounter for other antenatal screening follow-up: Secondary | ICD-10-CM

## 2019-11-08 DIAGNOSIS — O99213 Obesity complicating pregnancy, third trimester: Secondary | ICD-10-CM

## 2019-11-08 DIAGNOSIS — O0990 Supervision of high risk pregnancy, unspecified, unspecified trimester: Secondary | ICD-10-CM | POA: Insufficient documentation

## 2019-11-08 DIAGNOSIS — Z3A29 29 weeks gestation of pregnancy: Secondary | ICD-10-CM

## 2019-12-06 ENCOUNTER — Ambulatory Visit: Payer: Medicaid Other | Attending: Maternal & Fetal Medicine

## 2019-12-06 ENCOUNTER — Ambulatory Visit: Payer: Medicaid Other

## 2019-12-06 ENCOUNTER — Other Ambulatory Visit: Payer: Self-pay

## 2019-12-06 ENCOUNTER — Other Ambulatory Visit: Payer: Self-pay | Admitting: *Deleted

## 2019-12-06 ENCOUNTER — Other Ambulatory Visit: Payer: Self-pay | Admitting: Maternal & Fetal Medicine

## 2019-12-06 ENCOUNTER — Ambulatory Visit: Payer: Medicaid Other | Admitting: *Deleted

## 2019-12-06 VITALS — BP 126/77 | HR 93

## 2019-12-06 DIAGNOSIS — D649 Anemia, unspecified: Secondary | ICD-10-CM

## 2019-12-06 DIAGNOSIS — O0990 Supervision of high risk pregnancy, unspecified, unspecified trimester: Secondary | ICD-10-CM | POA: Insufficient documentation

## 2019-12-06 DIAGNOSIS — E669 Obesity, unspecified: Secondary | ICD-10-CM

## 2019-12-06 DIAGNOSIS — O403XX0 Polyhydramnios, third trimester, not applicable or unspecified: Secondary | ICD-10-CM

## 2019-12-06 DIAGNOSIS — O99213 Obesity complicating pregnancy, third trimester: Secondary | ICD-10-CM

## 2019-12-06 DIAGNOSIS — Z362 Encounter for other antenatal screening follow-up: Secondary | ICD-10-CM

## 2019-12-06 DIAGNOSIS — O99013 Anemia complicating pregnancy, third trimester: Secondary | ICD-10-CM

## 2019-12-06 DIAGNOSIS — O09293 Supervision of pregnancy with other poor reproductive or obstetric history, third trimester: Secondary | ICD-10-CM

## 2019-12-06 DIAGNOSIS — Z3A33 33 weeks gestation of pregnancy: Secondary | ICD-10-CM

## 2019-12-06 DIAGNOSIS — O09893 Supervision of other high risk pregnancies, third trimester: Secondary | ICD-10-CM | POA: Diagnosis not present

## 2019-12-21 LAB — OB RESULTS CONSOLE GBS: GBS: POSITIVE

## 2020-01-01 ENCOUNTER — Other Ambulatory Visit: Payer: Self-pay

## 2020-01-03 ENCOUNTER — Ambulatory Visit: Payer: Medicaid Other

## 2020-01-04 ENCOUNTER — Other Ambulatory Visit: Payer: Self-pay

## 2020-01-04 ENCOUNTER — Ambulatory Visit: Payer: Medicaid Other | Attending: Obstetrics and Gynecology

## 2020-01-04 ENCOUNTER — Ambulatory Visit: Payer: Medicaid Other | Admitting: *Deleted

## 2020-01-04 VITALS — BP 135/89 | HR 96

## 2020-01-04 DIAGNOSIS — O403XX0 Polyhydramnios, third trimester, not applicable or unspecified: Secondary | ICD-10-CM | POA: Insufficient documentation

## 2020-01-04 DIAGNOSIS — O139 Gestational [pregnancy-induced] hypertension without significant proteinuria, unspecified trimester: Secondary | ICD-10-CM | POA: Diagnosis present

## 2020-01-04 DIAGNOSIS — O09293 Supervision of pregnancy with other poor reproductive or obstetric history, third trimester: Secondary | ICD-10-CM | POA: Diagnosis not present

## 2020-01-04 DIAGNOSIS — Z3A37 37 weeks gestation of pregnancy: Secondary | ICD-10-CM

## 2020-01-04 DIAGNOSIS — E669 Obesity, unspecified: Secondary | ICD-10-CM

## 2020-01-04 DIAGNOSIS — D649 Anemia, unspecified: Secondary | ICD-10-CM

## 2020-01-04 DIAGNOSIS — O09893 Supervision of other high risk pregnancies, third trimester: Secondary | ICD-10-CM | POA: Diagnosis not present

## 2020-01-04 DIAGNOSIS — O99213 Obesity complicating pregnancy, third trimester: Secondary | ICD-10-CM

## 2020-01-04 DIAGNOSIS — Z362 Encounter for other antenatal screening follow-up: Secondary | ICD-10-CM

## 2020-01-04 DIAGNOSIS — O99013 Anemia complicating pregnancy, third trimester: Secondary | ICD-10-CM

## 2020-01-08 ENCOUNTER — Telehealth (HOSPITAL_COMMUNITY): Payer: Self-pay | Admitting: *Deleted

## 2020-01-08 NOTE — Telephone Encounter (Signed)
Preadmission screen  

## 2020-01-09 ENCOUNTER — Encounter (HOSPITAL_COMMUNITY): Payer: Self-pay | Admitting: *Deleted

## 2020-01-09 ENCOUNTER — Telehealth (HOSPITAL_COMMUNITY): Payer: Self-pay | Admitting: *Deleted

## 2020-01-09 NOTE — Telephone Encounter (Signed)
Preadmission screen  

## 2020-01-12 ENCOUNTER — Other Ambulatory Visit (HOSPITAL_COMMUNITY)
Admission: RE | Admit: 2020-01-12 | Discharge: 2020-01-12 | Disposition: A | Discharge status: Home / Self Care | Payer: Medicaid Other | Source: Ambulatory Visit | Attending: Obstetrics & Gynecology | Admitting: Obstetrics & Gynecology

## 2020-01-12 DIAGNOSIS — Z20822 Contact with and (suspected) exposure to covid-19: Secondary | ICD-10-CM | POA: Diagnosis not present

## 2020-01-12 DIAGNOSIS — Z01812 Encounter for preprocedural laboratory examination: Secondary | ICD-10-CM | POA: Insufficient documentation

## 2020-01-12 LAB — SARS CORONAVIRUS 2 (TAT 6-24 HRS): SARS Coronavirus 2: NEGATIVE

## 2020-01-14 ENCOUNTER — Inpatient Hospital Stay (HOSPITAL_COMMUNITY)
Admission: AD | Admit: 2020-01-14 | Discharge: 2020-01-16 | DRG: 768 | Disposition: A | Discharge status: Home / Self Care | Payer: Medicaid Other | Attending: Obstetrics & Gynecology | Admitting: Obstetrics & Gynecology

## 2020-01-14 ENCOUNTER — Other Ambulatory Visit: Payer: Self-pay

## 2020-01-14 ENCOUNTER — Inpatient Hospital Stay (HOSPITAL_COMMUNITY): Payer: Medicaid Other | Admitting: Anesthesiology

## 2020-01-14 ENCOUNTER — Inpatient Hospital Stay (HOSPITAL_COMMUNITY): Payer: Medicaid Other

## 2020-01-14 ENCOUNTER — Encounter (HOSPITAL_COMMUNITY)
Admission: AD | Disposition: A | Discharge status: Home / Self Care | Payer: Self-pay | Source: Home / Self Care | Attending: Obstetrics & Gynecology

## 2020-01-14 ENCOUNTER — Encounter (HOSPITAL_COMMUNITY): Payer: Self-pay | Admitting: Obstetrics & Gynecology

## 2020-01-14 DIAGNOSIS — Z3A39 39 weeks gestation of pregnancy: Secondary | ICD-10-CM | POA: Diagnosis not present

## 2020-01-14 DIAGNOSIS — D62 Acute posthemorrhagic anemia: Secondary | ICD-10-CM | POA: Diagnosis not present

## 2020-01-14 DIAGNOSIS — O99824 Streptococcus B carrier state complicating childbirth: Secondary | ICD-10-CM | POA: Diagnosis present

## 2020-01-14 DIAGNOSIS — O409XX0 Polyhydramnios, unspecified trimester, not applicable or unspecified: Secondary | ICD-10-CM | POA: Diagnosis present

## 2020-01-14 DIAGNOSIS — O9081 Anemia of the puerperium: Secondary | ICD-10-CM | POA: Diagnosis not present

## 2020-01-14 DIAGNOSIS — Z91040 Latex allergy status: Secondary | ICD-10-CM

## 2020-01-14 DIAGNOSIS — O403XX0 Polyhydramnios, third trimester, not applicable or unspecified: Secondary | ICD-10-CM | POA: Diagnosis present

## 2020-01-14 DIAGNOSIS — O134 Gestational [pregnancy-induced] hypertension without significant proteinuria, complicating childbirth: Secondary | ICD-10-CM | POA: Diagnosis present

## 2020-01-14 DIAGNOSIS — O139 Gestational [pregnancy-induced] hypertension without significant proteinuria, unspecified trimester: Secondary | ICD-10-CM | POA: Diagnosis not present

## 2020-01-14 LAB — COMPREHENSIVE METABOLIC PANEL
ALT: 25 U/L (ref 0–44)
AST: 23 U/L (ref 15–41)
Albumin: 2.8 g/dL — ABNORMAL LOW (ref 3.5–5.0)
Alkaline Phosphatase: 171 U/L — ABNORMAL HIGH (ref 38–126)
Anion gap: 11 (ref 5–15)
BUN: 5 mg/dL — ABNORMAL LOW (ref 6–20)
CO2: 23 mmol/L (ref 22–32)
Calcium: 9.1 mg/dL (ref 8.9–10.3)
Chloride: 102 mmol/L (ref 98–111)
Creatinine, Ser: 0.69 mg/dL (ref 0.44–1.00)
GFR calc Af Amer: >60 mL/min (ref >60)
GFR calc non Af Amer: >60 mL/min (ref >60)
Glucose, Bld: 118 mg/dL — ABNORMAL HIGH (ref 70–99)
Potassium: 4 mmol/L (ref 3.5–5.1)
Sodium: 136 mmol/L (ref 135–145)
Total Bilirubin: 0.7 mg/dL (ref 0.3–1.2)
Total Protein: 6.2 g/dL — ABNORMAL LOW (ref 6.5–8.1)

## 2020-01-14 LAB — CBC
HCT: 35.2 % — ABNORMAL LOW (ref 36.0–46.0)
Hemoglobin: 12.2 g/dL (ref 12.0–15.0)
MCH: 26.6 pg (ref 26.0–34.0)
MCHC: 34.7 g/dL (ref 30.0–36.0)
MCV: 76.9 fL — ABNORMAL LOW (ref 80.0–100.0)
Platelets: 256 10*3/uL (ref 150–400)
RBC: 4.58 MIL/uL (ref 3.87–5.11)
RDW: 13.4 % (ref 11.5–15.5)
WBC: 7.6 10*3/uL (ref 4.0–10.5)
nRBC: 0 % (ref 0.0–0.2)

## 2020-01-14 LAB — PREPARE RBC (CROSSMATCH)

## 2020-01-14 LAB — RPR: RPR Ser Ql: NONREACTIVE

## 2020-01-14 SURGERY — DILATION AND EVACUATION, UTERUS
Anesthesia: Epidural | Laterality: Bilateral

## 2020-01-14 MED ORDER — DIPHENOXYLATE-ATROPINE 2.5-0.025 MG PO TABS
1.0000 | ORAL_TABLET | Freq: Once | ORAL | Status: AC
  Administered 2020-01-14: 1 via ORAL
  Filled 2020-01-14: qty 1

## 2020-01-14 MED ORDER — METHYLERGONOVINE MALEATE 0.2 MG/ML IJ SOLN
0.2000 mg | Freq: Once | INTRAMUSCULAR | Status: AC
  Filled 2020-01-14: qty 1

## 2020-01-14 MED ORDER — SODIUM CHLORIDE 0.9 % IV SOLN
5.0000 10*6.[IU] | Freq: Once | INTRAVENOUS | Status: AC
  Administered 2020-01-14: 5 10*6.[IU] via INTRAVENOUS
  Filled 2020-01-14: qty 5

## 2020-01-14 MED ORDER — OXYTOCIN BOLUS FROM INFUSION
333.0000 mL | Freq: Once | INTRAVENOUS | Status: AC
  Administered 2020-01-14: 333 mL via INTRAVENOUS

## 2020-01-14 MED ORDER — ONDANSETRON HCL 4 MG/2ML IJ SOLN
4.0000 mg | Freq: Four times a day (QID) | INTRAMUSCULAR | Status: DC | PRN
  Administered 2020-01-15: 4 mg via INTRAVENOUS
  Filled 2020-01-14: qty 2

## 2020-01-14 MED ORDER — LACTATED RINGERS IV SOLN
500.0000 mL | INTRAVENOUS | Status: DC | PRN
  Administered 2020-01-14: 500 mL via INTRAVENOUS

## 2020-01-14 MED ORDER — OXYTOCIN-SODIUM CHLORIDE 30-0.9 UT/500ML-% IV SOLN
2.5000 [IU]/h | INTRAVENOUS | Status: DC
  Administered 2020-01-14: 2.5 [IU]/h via INTRAVENOUS
  Filled 2020-01-14: qty 500

## 2020-01-14 MED ORDER — OXYTOCIN-SODIUM CHLORIDE 30-0.9 UT/500ML-% IV SOLN
1.0000 m[IU]/min | INTRAVENOUS | Status: DC
  Administered 2020-01-14: 2 m[IU]/min via INTRAVENOUS

## 2020-01-14 MED ORDER — ACETAMINOPHEN 325 MG PO TABS: 650.0000 mg | ORAL_TABLET | ORAL | Status: DC | PRN

## 2020-01-14 MED ORDER — LIDOCAINE HCL (PF) 1 % IJ SOLN: 30.0000 mL | INTRAMUSCULAR | Status: DC | PRN

## 2020-01-14 MED ORDER — CARBOPROST TROMETHAMINE 250 MCG/ML IM SOLN
INTRAMUSCULAR | Status: AC
  Administered 2020-01-14: 250 ug
  Filled 2020-01-14: qty 1

## 2020-01-14 MED ORDER — OXYCODONE-ACETAMINOPHEN 5-325 MG PO TABS: 1.0000 | ORAL_TABLET | ORAL | Status: DC | PRN

## 2020-01-14 MED ORDER — SOD CITRATE-CITRIC ACID 500-334 MG/5ML PO SOLN
30.0000 mL | ORAL | Status: DC | PRN
  Administered 2020-01-14: 30 mL via ORAL
  Filled 2020-01-14: qty 30

## 2020-01-14 MED ORDER — FENTANYL-BUPIVACAINE-NACL 0.5-0.125-0.9 MG/250ML-% EP SOLN
12.0000 mL/h | EPIDURAL | Status: DC | PRN
  Filled 2020-01-14: qty 250

## 2020-01-14 MED ORDER — LACTATED RINGERS IV SOLN: INTRAVENOUS | Status: DC

## 2020-01-14 MED ORDER — LACTATED RINGERS IV SOLN
500.0000 mL | Freq: Once | INTRAVENOUS | Status: AC
  Administered 2020-01-14: 1000 mL via INTRAVENOUS

## 2020-01-14 MED ORDER — EPHEDRINE 5 MG/ML INJ: 10.0000 mg | INTRAVENOUS | Status: DC | PRN

## 2020-01-14 MED ORDER — PENICILLIN G POT IN DEXTROSE 60000 UNIT/ML IV SOLN
3.0000 10*6.[IU] | INTRAVENOUS | Status: DC
  Administered 2020-01-14 (×4): 3 10*6.[IU] via INTRAVENOUS
  Filled 2020-01-14 (×4): qty 50

## 2020-01-14 MED ORDER — FENTANYL CITRATE (PF) 100 MCG/2ML IJ SOLN
INTRAMUSCULAR | Status: DC | PRN
Start: 2020-01-14 — End: 2020-01-14

## 2020-01-14 MED ORDER — PHENYLEPHRINE 40 MCG/ML (10ML) SYRINGE FOR IV PUSH (FOR BLOOD PRESSURE SUPPORT)
80.0000 ug | PREFILLED_SYRINGE | INTRAVENOUS | Status: DC | PRN
  Filled 2020-01-14: qty 10

## 2020-01-14 MED ORDER — FENTANYL CITRATE (PF) 100 MCG/2ML IJ SOLN
50.0000 ug | INTRAMUSCULAR | Status: DC | PRN
  Administered 2020-01-14 (×5): 100 ug via INTRAVENOUS
  Filled 2020-01-14 (×6): qty 2

## 2020-01-14 MED ORDER — OXYTOCIN-SODIUM CHLORIDE 30-0.9 UT/500ML-% IV SOLN
INTRAVENOUS | Status: AC
  Filled 2020-01-14: qty 500

## 2020-01-14 MED ORDER — BUPIVACAINE HCL (PF) 0.75 % IJ SOLN
INTRAMUSCULAR | Status: DC | PRN
Start: 2020-01-14 — End: 2020-01-14
  Administered 2020-01-14: 12 mL/h via EPIDURAL

## 2020-01-14 MED ORDER — LIDOCAINE HCL (PF) 1 % IJ SOLN
INTRAMUSCULAR | Status: DC | PRN
  Administered 2020-01-14 (×2): 4 mL via EPIDURAL

## 2020-01-14 MED ORDER — LIDOCAINE-EPINEPHRINE (PF) 2 %-1:200000 IJ SOLN
INTRAMUSCULAR | Status: DC | PRN
  Administered 2020-01-14 (×3): 5 mL via EPIDURAL

## 2020-01-14 MED ORDER — SODIUM CHLORIDE 0.9 % IV SOLN: 10.0000 mL/h | Freq: Once | INTRAVENOUS | Status: DC

## 2020-01-14 MED ORDER — OXYCODONE-ACETAMINOPHEN 5-325 MG PO TABS: 2.0000 | ORAL_TABLET | ORAL | Status: DC | PRN

## 2020-01-14 MED ORDER — PHENYLEPHRINE 40 MCG/ML (10ML) SYRINGE FOR IV PUSH (FOR BLOOD PRESSURE SUPPORT): 80.0000 ug | PREFILLED_SYRINGE | INTRAVENOUS | Status: DC | PRN

## 2020-01-14 MED ORDER — MISOPROSTOL 200 MCG PO TABS
ORAL_TABLET | ORAL | Status: AC
  Filled 2020-01-14: qty 1

## 2020-01-14 MED ORDER — DIPHENHYDRAMINE HCL 50 MG/ML IJ SOLN: 12.5000 mg | INTRAMUSCULAR | Status: DC | PRN

## 2020-01-14 MED ORDER — TERBUTALINE SULFATE 1 MG/ML IJ SOLN: 0.2500 mg | Freq: Once | INTRAMUSCULAR | Status: DC | PRN

## 2020-01-14 MED ORDER — LACTATED RINGERS IV SOLN: 500.0000 mL | Freq: Once | INTRAVENOUS | Status: DC

## 2020-01-14 MED ORDER — FENTANYL CITRATE (PF) 100 MCG/2ML IJ SOLN
INTRAMUSCULAR | Status: DC | PRN
Start: 2020-01-14 — End: 2020-01-14
  Administered 2020-01-14: 100 ug via EPIDURAL

## 2020-01-14 MED ORDER — SODIUM CHLORIDE 0.9 % IV SOLN
3.0000 g | Freq: Once | INTRAVENOUS | Status: AC
  Administered 2020-01-14: 3 g via INTRAVENOUS
  Filled 2020-01-14: qty 8

## 2020-01-14 MED ORDER — MISOPROSTOL 200 MCG PO TABS
400.0000 ug | ORAL_TABLET | Freq: Once | ORAL | Status: AC
  Administered 2020-01-14: 400 ug via RECTAL

## 2020-01-14 MED ORDER — MISOPROSTOL 25 MCG QUARTER TABLET
25.0000 ug | ORAL_TABLET | ORAL | Status: DC | PRN
  Administered 2020-01-14 (×2): 25 ug via VAGINAL
  Filled 2020-01-14 (×2): qty 1

## 2020-01-14 MED ORDER — MISOPROSTOL 200 MCG PO TABS
ORAL_TABLET | ORAL | Status: AC
  Filled 2020-01-14: qty 4

## 2020-01-14 MED ORDER — TRANEXAMIC ACID-NACL 1000-0.7 MG/100ML-% IV SOLN
INTRAVENOUS | Status: AC
  Filled 2020-01-14: qty 100

## 2020-01-14 MED ORDER — METHYLERGONOVINE MALEATE 0.2 MG/ML IJ SOLN
INTRAMUSCULAR | Status: AC
  Administered 2020-01-14: 0.2 mg
  Filled 2020-01-14: qty 1

## 2020-01-14 NOTE — Anesthesia Preprocedure Evaluation (Signed)
Anesthesia Evaluation  Patient identified by MRN, date of birth, ID band Patient awake    Reviewed: Allergy & Precautions, Patient's Chart, lab work & pertinent test results  History of Anesthesia Complications Negative for: history of anesthetic complications  Airway Mallampati: II  TM Distance: >3 FB Neck ROM: Full    Dental no notable dental hx.    Pulmonary neg pulmonary ROS,    Pulmonary exam normal        Cardiovascular negative cardio ROS Normal cardiovascular exam     Neuro/Psych negative neurological ROS  negative psych ROS   GI/Hepatic negative GI ROS, Neg liver ROS,   Endo/Other  negative endocrine ROS  Renal/GU negative Renal ROS  negative genitourinary   Musculoskeletal negative musculoskeletal ROS (+)   Abdominal   Peds  Hematology negative hematology ROS (+)   Anesthesia Other Findings Day of surgery medications reviewed with patient.  Reproductive/Obstetrics (+) Pregnancy (Hx of shoulder dystocia/PPH with previous delivery)                             Anesthesia Physical Anesthesia Plan  ASA: II  Anesthesia Plan: Epidural   Post-op Pain Management:    Induction:   PONV Risk Score and Plan: Treatment may vary due to age or medical condition  Airway Management Planned: Natural Airway  Additional Equipment:   Intra-op Plan:   Post-operative Plan:   Informed Consent: I have reviewed the patients History and Physical, chart, labs and discussed the procedure including the risks, benefits and alternatives for the proposed anesthesia with the patient or authorized representative who has indicated his/her understanding and acceptance.       Plan Discussed with:   Anesthesia Plan Comments:         Anesthesia Quick Evaluation

## 2020-01-14 NOTE — H&P (Signed)
[name] is a 24 y.o. female, G2P1001, IUP at 68 weeks, presenting for IOL for polyhydramnios. Pt endorse + Fm. Denies vaginal leakage. Denies vaginal bleeding. Denies feeling cxt's. 8/31 BPP 8/8. EFW 7.13lbs on 8/26. GBS + 8/12. Low risk baby Female, in pt circ desired. Hgb Trait C, per father negative. Possible LGA @ 24 weeks was 97%, 29 weeks was 86%. Allergy to latex. H/O PreE, not in this pregnancy. Last delivery was vacuumed, shoulder dystocia with episiotomy and PPH.   Patient Active Problem List   Diagnosis Date Noted  . Polyhydramnios 01/14/2020  . Postpartum anemia 09/15/2018  . Normal labor 09/14/2018  . Gestational hypertension 09/14/2018  . Shoulder dystocia during labor and delivery 09/14/2018  . Delivery of first pregnancy by vacuum extraction 09/14/2018  . Postpartum hemorrhage 09/14/2018     Medications Prior to Admission  Medication Sig Dispense Refill Last Dose  . cetirizine (ZYRTEC) 10 MG tablet Take 10 mg by mouth daily.   Past Month at Unknown time  . prenatal vitamin w/FE, FA (PRENATAL 1 + 1) 27-1 MG TABS tablet Take 1 tablet by mouth daily at 12 noon.   01/13/2020 at Unknown time  . ferrous sulfate 325 (65 FE) MG tablet Take 1 tablet (325 mg total) by mouth 2 (two) times daily with a meal. (Patient not taking: Reported on 11/08/2019) 60 tablet 2   . ibuprofen (ADVIL) 600 MG tablet Take 1 tablet (600 mg total) by mouth every 6 (six) hours as needed for moderate pain. (Patient not taking: Reported on 11/08/2019) 30 tablet 0     Past Medical History:  Diagnosis Date  . History of postpartum hemorrhage   . PCOS (polycystic ovarian syndrome)      No current facility-administered medications on file prior to encounter.   Current Outpatient Medications on File Prior to Encounter  Medication Sig Dispense Refill  . cetirizine (ZYRTEC) 10 MG tablet Take 10 mg by mouth daily.    . prenatal vitamin w/FE, FA (PRENATAL 1 + 1) 27-1 MG TABS tablet Take 1 tablet by mouth daily  at 12 noon.    . ferrous sulfate 325 (65 FE) MG tablet Take 1 tablet (325 mg total) by mouth 2 (two) times daily with a meal. (Patient not taking: Reported on 11/08/2019) 60 tablet 2  . ibuprofen (ADVIL) 600 MG tablet Take 1 tablet (600 mg total) by mouth every 6 (six) hours as needed for moderate pain. (Patient not taking: Reported on 11/08/2019) 30 tablet 0     Allergies  Allergen Reactions  . Latex Rash    History of present pregnancy: Pt Info/Preference:  Screening/Consents:  Labs:   EDD: Estimated Date of Delivery: 01/21/20  Establised: Patient's last menstrual period was 04/05/2019.  Anatomy Scan: Date: 11/08/2019 Placenta Location: anterior Genetic Screen: Panoroma:low risk female AFP:  First Tri: Quad:  Office: [facility]            First PNV: 12.1 wg Blood Type --/--/O POS (09/05 0120)  Language: endglish Last PNV: 38.3 wg Rhogam    Flu Vaccine:  declined   Antibody NEG (09/05 0120)  TDaP vaccine utd   GTT: Early: 5.2 Third Trimester: 100  Feeding Plan: breast BTL: no Rubella: Immune (02/17 0000)  Contraception: ??? VBAC: no RPR: Nonreactive (02/17 0000)   Circumcision: In pt desired   HBsAg: Negative (02/17 0000)  Pediatrician:  Dr Azucena Kuba   HIV: Non-reactive (02/17 0000)   Prenatal Classes: no Additional [place]:  8/31 BPP 8/8. EFW 7.13lbs  on 8/26.  GBS: Positive/-- (08/12 0000)(For PCN allergy, check sensitivities)       Chlamydia: neg    MFM Referral/Consult:  GC: neg  Support Person: husband   PAP: 2020-normal  Pain Management: epidural Neonatologist Referral:  Hgb Electrophoresis:  2.9%  Birth Plan: none   Hgb NOB: 13.4    28W: 12   OB History    Gravida  2   Para  1   Term  1   Preterm      AB      Living  1     SAB      TAB      Ectopic      Multiple  0   Live Births  1          Past Medical History:  Diagnosis Date  . History of postpartum hemorrhage   . PCOS (polycystic ovarian syndrome)    Past Surgical History:  Procedure Laterality Date  .  MANDIBLE FRACTURE SURGERY    . NECK SURGERY    . TONSILLECTOMY     Family History: family history is not on file. Social History:  reports that she has never smoked. She has never used smokeless tobacco. She reports that she does not drink alcohol and does not use drugs.   Prenatal Transfer Tool  Maternal Diabetes: No Genetic Screening: Normal Maternal Ultrasounds/Referrals: Normal Fetal Ultrasounds or other Referrals:  None Maternal Substance Abuse:  No Significant Maternal Medications:  None Significant Maternal Lab Results: Group B Strep positive  ROS:  Review of Systems  Constitutional: Negative.   HENT: Negative.   Eyes: Negative.   Respiratory: Negative.   Cardiovascular: Negative.   Gastrointestinal: Negative.   Genitourinary: Negative.   Musculoskeletal: Negative.   Skin: Negative.   Neurological: Negative.   Endo/Heme/Allergies: Negative.   Psychiatric/Behavioral: Negative.      Physical Exam: Temp 98.3 F (36.8 C) (Oral)   Resp 16   Ht 5\' 9"  (1.753 m)   Wt 97.5 kg   LMP 04/05/2019   BMI 31.75 kg/m   Physical Exam HENT:     Head: Normocephalic.     Nose: Nose normal.     Mouth/Throat:     Mouth: Mucous membranes are moist.  Eyes:     Extraocular Movements: Extraocular movements intact.     Conjunctiva/sclera: Conjunctivae normal.     Pupils: Pupils are equal, round, and reactive to light.  Cardiovascular:     Rate and Rhythm: Normal rate and regular rhythm.     Pulses: Normal pulses.     Heart sounds: Normal heart sounds.  Pulmonary:     Effort: Pulmonary effort is normal.     Breath sounds: Normal breath sounds.  Abdominal:     General: Bowel sounds are normal.     Palpations: Abdomen is soft.  Musculoskeletal:        General: Normal range of motion.     Cervical back: Normal range of motion and neck supple.  Skin:    General: Skin is warm.     Capillary Refill: Capillary refill takes less than 2 seconds.  Neurological:     Mental Status:  She is alert.  Psychiatric:        Mood and Affect: Mood normal.        Behavior: Behavior normal.      NST: FHR baseline 130 bpm, Variability: moderate, Accelerations:present, Decelerations:  Absent= Cat 1/Reactive UC:   none SVE: 0/30/-3   , vertex verified  by fetal sutures.  Leopold's: Position vertex, EFW 8.5lbs via leopold's.  Pelvis proven to 7.2lbs  Labs: Results for orders placed or performed during the hospital encounter of 01/14/20 (from the past 24 hour(s))  Type and screen [facility]     Status: None   Collection Time: 01/14/20  1:20 AM  Result Value Ref Range   ABO/RH(D) O POS    Antibody Screen NEG    Sample Expiration      01/17/2020,2359 Performed at [facility], [address] [place], [place] [address]   CBC     Status: Abnormal   Collection Time: 01/14/20  1:44 AM  Result Value Ref Range   WBC 7.6 4.0 - 10.5 K/uL   RBC 4.58 3.87 - 5.11 MIL/uL   Hemoglobin 12.2 12.0 - 15.0 g/dL   HCT 60.4 (L) 36 - 46 %   MCV 76.9 (L) 80.0 - 100.0 fL   MCH 26.6 26.0 - 34.0 pg   MCHC 34.7 30.0 - 36.0 g/dL   RDW 54.0 98.1 - 19.1 %   Platelets 256 150 - 400 K/uL   nRBC 0.0 0.0 - 0.2 %    Imaging:  [place] MFM OB FOLLOW UP  Result Date: 01/04/2020 ----------------------------------------------------------------------  OBSTETRICS REPORT                       (Signed Final 01/04/2020 11:43 am) ---------------------------------------------------------------------- Patient Info  ID #:       [id number]                          D.O.B.:  03-13-1996 (24 yrs)  Name:       Catherine M Villafranca                  Visit Date: 01/04/2020 10:37 am ---------------------------------------------------------------------- Performed By  Attending:        Lin Landsman      Ref. Address:     [facility]                    MD                                                             Obstetrics &                                                             Gynecology                                                              [address]                                                             [address]                                                             [place], [place]                                                             [address]  Performed By:     Eden Lathe BS      Location:         [facility]                    RDMS RVT                                 [facility]  Referred By:      Nigel Bridgeman                    CNM ---------------------------------------------------------------------- Orders  #  Description                           Code        Ordered By  1  [place] MFM OB FOLLOW UP                   [id number]    Noralee Space ----------------------------------------------------------------------  #  Order #                     Accession #                Episode #  1  [id number]                   [id number]                 [id number] ---------------------------------------------------------------------- Indications  Encounter for other antenatal screening        Z36.2  follow-up  Obesity complicating pregnancy, third          O99.213  trimester  Polyhydramnios, third trimester, antepartum    O40.3XX0  condition or complication, unspecified fetus  Short interval between pregancies, 3rd         O09.893  trimester  Poor obstetrical history (Shoulder             O09.299  Dystocia,Postpartum hemorrhage)  Poor obstetric history: Previous               O09.299  preeclampsia / eclampsia/gestational HTN  Anemia during pregnancy in third trimester     O99.013  [redacted] weeks gestation of pregnancy                Z3A.37 ---------------------------------------------------------------------- Vital Signs                                                 Height:        5'9"  ---------------------------------------------------------------------- Fetal Evaluation  Num Of Fetuses:         1  Fetal Heart Rate(bpm):  169  Cardiac Activity:       Observed  Presentation:           Cephalic  Placenta:               Anterior  P. Cord Insertion:      Previously Visualized  AFI Sum(cm)     %Tile       Largest Pocket(cm)  22.7            89          6.6  RUQ(cm)       RLQ(cm)       LUQ(cm)        LLQ(cm)  4.7           6.6           5.8            5.6 ---------------------------------------------------------------------- Biometry  BPD:        92  mm     G. Age:  37w 3d         64  %    CI:        79.16   %    70 - 86                                                          FL/HC:      22.0   %    20.9 - 22.7  HC:      326.9  mm     G. Age:  37w 1d         18  %    HC/AC:      0.91        0.92 - 1.05  AC:      359.7  mm     G. Age:  39w 6d         98  %    FL/BPD:     78.3   %    71 - 87  FL:         72  mm     G. Age:  36w 6d         33  %    FL/AC:      20.0   %    20 - 24  HUM:      64.3  mm     G. Age:  37w 2d         69  %  Est. FW:    3539  gm    7 lb 13 oz      84  % ---------------------------------------------------------------------- OB History  Gravidity:    2         Term:   1  Living:       1 ---------------------------------------------------------------------- Gestational Age  LMP:           39w 1d        Date:  04/05/19                 EDD:   01/10/20  Clinical EDD:  37w 4d                                        EDD:   01/21/20  U/S Today:     37w 6d                                        EDD:   01/19/20  Best:          37w 4d     Det. By:  Clinical EDD             EDD:   01/21/20 ---------------------------------------------------------------------- Anatomy  Cranium:               Appears normal         LVOT:                   Previously seen  Cavum:                 Appears normal         Aortic Arch:            Previously seen  Ventricles:            Previously seen        Ductal  Arch:            Previously seen  Choroid Plexus:        Previously seen        Diaphragm:              Appears normal  Cerebellum:            Previously seen        Stomach:                Appears normal, left                                                                        sided  Posterior Fossa:       Previously seen  Abdomen:                Appears normal  Nuchal Fold:           Not applicable (>20    Abdominal Wall:         Previously seen                         wks GA)  Face:                  Appears normal         Cord Vessels:           Previously seen                         (orbits and profile)  Lips:                  Appears normal         Kidneys:                Appear normal  Palate:                Previously seen        Bladder:                Appears normal  Thoracic:              Appears normal         Spine:                  Previously seen  Heart:                 Previously seen        Upper Extremities:      Previously seen  RVOT:                  Previously seen        Lower Extremities:      Previously seen  Other:  Nasal bone, heels, and 5th digit previously visualized. Female gender          previously seen. Technically difficult due to advanced gestational age. ---------------------------------------------------------------------- Cervix Uterus Adnexa  Cervix  Not visualized (advanced GA >24wks)  Right Ovary  Within normal limits.  Left Ovary  Not visualized.  Cul De Sac  No free fluid seen.  Adnexa  No abnormality visualized. ---------------------------------------------------------------------- Impression  Follow up growth due to polyhydramnios  Normal interval growth with normal amniotic fluid and fetal  movement.  Blood pressure measurement 133/95 and 129/90.  She is asymptomatic. She has a follow up appointment. I  spoke with Atilano Median who will discuss with her provider  tomorrow. ---------------------------------------------------------------------- Recommendations  Follow  up as clincally indicated.  Consider delivery between 37-38 weeks if blood pressure  remains elevated given history of prior preeclampsia. ----------------------------------------------------------------------               Lin Landsman, MD Electronically Signed Final Report   01/04/2020 11:43 am ----------------------------------------------------------------------   MAU Course: Orders Placed This Encounter  Procedures  . CBC  . RPR  . Comprehensive metabolic panel  . Diet clear liquid Room service appropriate? Yes; Fluid consistency: Thin  . Vitals signs per unit policy  . Notify Physician  . Fetal monitoring per unit policy  . Activity as tolerated  . Cervical Exam  . Measure blood pressure post delivery every 15  min x 1 hour then every 30 min x 1 hour  . Fundal check post delivery every 15 min x 1 hour then every 30 min x 1 hour  . If Rapid HIV test positive or known HIV positive: initiate AZT orders  . May in and out cath x 2 for inability to void  . Insert foley catheter  . Discontinue foley prior to vaginal delivery  . Initiate Carrier Fluid Protocol  . Initiate Oral Care Protocol  . Order Rapid HIV per protocol if no results on chart  . Patient may have epidural placement upon request  . Evaluate fetal heart rate to establish reassuring pattern prior to initiating Cytotec or Pitocin  . Perform a cervical exam prior to initiating Cytotec or Pitocin  . Discontinue Pitocin if tachysystole with non-reassuring FHR is present  . Nofify MD/CNM if tachysystole with non-reassuring FHR is present  . Initiate intrauterine resuscitation if tachysystole with non-reasuring FHR is present  . If tachysystole WITH reassuring FHR present notify MD / CNM  . May administer Terbutaline 0.25 mg SQ x 1 dose if tachysystole with non-reassuring FHR is presesnt  . Labor Induction  . Full code  . Type and screen [facility]  . Insert and maintain IV Line  . Admit to Inpatient  (patient's expected length of stay will be greater than 2 midnights or inpatient only procedure)   Meds ordered this encounter  Medications  . lactated ringers infusion  . oxytocin (PITOCIN) IV BOLUS FROM BAG  . oxytocin (PITOCIN) IV infusion 30 units in NS 500 mL - Premix  . lactated ringers infusion 500-1,000 mL  . acetaminophen (TYLENOL) tablet 650 mg  . oxyCODONE-acetaminophen (PERCOCET/ROXICET) 5-325 MG per tablet 1 tablet  . oxyCODONE-acetaminophen (PERCOCET/ROXICET) 5-325 MG per tablet 2 tablet  . ondansetron (ZOFRAN) injection 4 mg  . sodium citrate-citric acid (ORACIT) solution 30 mL  . lidocaine (PF) (XYLOCAINE) 1 % injection 30 mL  . FOLLOWED BY Linked Order Group   . penicillin G potassium 5 Million Units in sodium chloride 0.9 % 250 mL IVPB     Order Specific Question:   Antibiotic Indication:     Answer:   Group B Strep Prophylaxis   . penicillin G potassium 3 Million Units in dextrose 50mL IVPB     Order Specific Question:   Antibiotic Indication:     Answer:   Group B Strep Prophylaxis  . fentaNYL (SUBLIMAZE) injection 50-100 mcg  . terbutaline (BRETHINE) injection 0.25 mg  . misoprostol (CYTOTEC) tablet 25 mcg    Assessment/Plan: Catherine Burnard BuntingM Corsello is a 24 y.o. female, G2P1001, IUP at 39 weeks, presenting for IOL for polyhydramnios. Pt endorse + Fm. Denies vaginal leakage. Denies vaginal bleeding. Denies feeling cxt's. 8/31 BPP 8/8. EFW 7.13lbs on 8/26. GBS + 8/12. Low risk baby Female, in pt circ desired. Hgb Trait C, per father negative. Possible LGA @ 24 weeks was 97%, 29 weeks was 86%. Allergy to latex. H/O PreE, not in this pregnancy. Last delivery was vacuumed, shoulder dystocia with episiotomy and PPH.  FWB: Cat 1 Fetal Tracing.   Plan: Admit to Birthing Suite per consult with Dr Charlotta Newtonzan Routine [facility] orders Cytotex for cervical ripening.  Pain med/epidural prn PCN G for GBS prophylaxis  Anticipate labor progression   Dale DurhamJade Miquel Lamson NP-C, CNM, MSN 01/14/2020, 6:35  AM

## 2020-01-14 NOTE — Anesthesia Procedure Notes (Signed)
Epidural Patient location during procedure: OB Start time: 01/14/2020 4:32 PM End time: 01/14/2020 4:35 PM  Staffing Anesthesiologist: Kaylyn Layer, MD Performed: anesthesiologist   Preanesthetic Checklist Completed: patient identified, IV checked, risks and benefits discussed, monitors and equipment checked, pre-op evaluation and timeout performed  Epidural Patient position: sitting Prep: DuraPrep and site prepped and draped Patient monitoring: continuous pulse ox, blood pressure and heart rate Approach: midline Location: L3-L4 Injection technique: LOR air  Needle:  Needle type: Tuohy  Needle gauge: 17 G Needle length: 9 cm Needle insertion depth: 9 cm Catheter type: closed end flexible Catheter size: 19 Gauge Catheter at skin depth: 14 cm Test dose: negative and Other (1% lidocaine)  Assessment Events: blood not aspirated, injection not painful, no injection resistance, no paresthesia and negative IV test  Additional Notes Patient identified. Risks, benefits, and alternatives discussed with patient including but not limited to bleeding, infection, nerve damage, paralysis, failed block, incomplete pain control, headache, blood pressure changes, nausea, vomiting, reactions to medication, itching, and postpartum back pain. Confirmed with bedside nurse the patient's most recent platelet count. Confirmed with patient that they are not currently taking any anticoagulation, have any bleeding history, or any family history of bleeding disorders. Patient expressed understanding and wished to proceed. All questions were answered. Sterile technique was used throughout the entire procedure. Please see nursing notes for vital signs.   Crisp LOR after one needle redirection. Test dose was given through epidural catheter and negative prior to continuing to dose epidural or start infusion. Warning signs of high block given to the patient including shortness of breath, tingling/numbness in  hands, complete motor block, or any concerning symptoms with instructions to call for help. Patient was given instructions on fall risk and not to get out of bed. All questions and concerns addressed with instructions to call with any issues or inadequate analgesia.  Reason for block:procedure for pain

## 2020-01-14 NOTE — Progress Notes (Signed)
S: Feeling increased pain with contractions. Discussed the R/B/A to [facility] catheter placement for cervical ripening and patient consents to procedure. Spouse and mother at the bedside and supportive.   O: Vitals:   01/14/20 0206 01/14/20 0519 01/14/20 0633 01/14/20 0753  BP:  129/86 133/85 (!) 141/85  Pulse:  92 85 95  Resp: 16 18 18 16   Temp: 98.3 F (36.8 C)   97.6 F (36.4 C)  TempSrc: Oral   Oral  Weight:      Height:       FHT:  FHR: 125 bpm, variability: moderate,  accelerations:  Present,  decelerations:  Absent UC:   irregular, every 1.5-4 minutes SVE:   Dilation: 3 Effacement (%): 60 Station: -3 Exam by:: A. [email or web address] CNM  Cook cathter placed without difficulty. 80ccs of fluid placed in uterine balloon.   A / P: Induction of labor due to polyhydramnios, s/p 2 doses of Cytotec for cervical ripening  Fetal Wellbeing:  Category I Pain Control:  Labor support without medications  GBS: Positive, adequately treated with PCN Anticipated MOD:  NSVD  Will start Pitocin 2x2 for until adequate contractions. Consider controlled AROM after Yetta Barre is expelled.  Adriana Simas, CNM, MSN 01/14/2020, 10:17 AM

## 2020-01-14 NOTE — Progress Notes (Signed)
S: Feeling more pain with contractions. Discussed the R/B/A of AROM and placement of FSE and IUPC. Patient consents to procedures. Spouse and mother at the bedside providing support.   O: Vitals:   01/14/20 1317 01/14/20 1349 01/14/20 1437 01/14/20 1610  BP: 119/65 (!) 139/91 130/85 133/88  Pulse: 83 91 86 97  Resp: 16 16 16 16   Temp: 98.1 F (36.7 C)     TempSrc: Oral     Weight:      Height:       FHT:  FHR: 130 bpm, variability: moderate,  accelerations:  Present,  decelerations:  Absent UC:   regular, every 1.5-3.5 minutes SVE:   Dilation: 5.5 Effacement (%): 60 Station: -3 Exam by:: A Janneth Krasner CNM  Controlled AROM with FSE, large amount of clear fluid, FSE placed on fetal vertex IUPC placed without difficulty  A / P: Induction of labor due to polyhydramnios, s/p 2 doses of Cytotec, Cook catheter, and Pitocin infusing  Fetal Wellbeing:  Category I Pain Control:  IV pain meds  GBS: Positive, treated adequately with PCN Anticipated MOD:  NSVD   Will continue to titrate Pitocin until adequate MVUs.   Dr. [email or web address] updated on patient status and plan of care.   Charlotta Newton, CNM, MSN 01/14/2020, 4:25 PM

## 2020-01-14 NOTE — Progress Notes (Signed)
S: Sitting up in high Fowlers position. Feeling rectal pressure. Family at the bedside.   O: Vitals:   01/14/20 1931 01/14/20 2004 01/14/20 2031 01/14/20 2101  BP: 109/67 121/62 117/60 135/78  Pulse: 84 (!) 120 (!) 111 (!) 121  Resp: 17 16 16 17   Temp:  99 F (37.2 C)    TempSrc:  Oral    SpO2:      Weight:      Height:       FHT:  FHR: 125 bpm, variability: moderate,  accelerations:  Present,  decelerations:  Present Variable decels to 100s UC:   regular, every 1.5-2.5 minutes SVE:   Dilation: 8-9 Effacement (%): 90 Station: 0 Exam by:: Luxe Cuadros CNM  A / P: Induction of labor due to polyhydramnios, s/p 2 doses of Cytotec, Cook catheter, progressing well on Pitocin  Fetal Wellbeing:  Category I Pain Control:  Epidural Anticipated MOD:  NSVD   Encourage frequent position changes to facilitate rotation and descent of the fetal head.   [email or web address], CNM, MSN 01/14/2020, 9:35 PM

## 2020-01-15 ENCOUNTER — Encounter (HOSPITAL_COMMUNITY): Payer: Self-pay | Admitting: Obstetrics & Gynecology

## 2020-01-15 DIAGNOSIS — O139 Gestational [pregnancy-induced] hypertension without significant proteinuria, unspecified trimester: Secondary | ICD-10-CM | POA: Diagnosis not present

## 2020-01-15 DIAGNOSIS — D62 Acute posthemorrhagic anemia: Secondary | ICD-10-CM | POA: Diagnosis not present

## 2020-01-15 LAB — COMPREHENSIVE METABOLIC PANEL
ALT: 20 U/L (ref 0–44)
AST: 41 U/L (ref 15–41)
Albumin: 2 g/dL — ABNORMAL LOW (ref 3.5–5.0)
Alkaline Phosphatase: 144 U/L — ABNORMAL HIGH (ref 38–126)
Anion gap: 11 (ref 5–15)
BUN: <5 mg/dL — ABNORMAL LOW (ref 6–20)
CO2: 18 mmol/L — ABNORMAL LOW (ref 22–32)
Calcium: 8.7 mg/dL — ABNORMAL LOW (ref 8.9–10.3)
Chloride: 106 mmol/L (ref 98–111)
Creatinine, Ser: 0.65 mg/dL (ref 0.44–1.00)
GFR calc Af Amer: >60 mL/min (ref >60)
GFR calc non Af Amer: >60 mL/min (ref >60)
Glucose, Bld: 105 mg/dL — ABNORMAL HIGH (ref 70–99)
Potassium: 4.5 mmol/L (ref 3.5–5.1)
Sodium: 135 mmol/L (ref 135–145)
Total Bilirubin: 1 mg/dL (ref 0.3–1.2)
Total Protein: 5.4 g/dL — ABNORMAL LOW (ref 6.5–8.1)

## 2020-01-15 LAB — CBC WITH DIFFERENTIAL/PLATELET
Abs Immature Granulocytes: 0.12 10*3/uL — ABNORMAL HIGH (ref 0.00–0.07)
Basophils Absolute: 0 10*3/uL (ref 0.0–0.1)
Basophils Relative: 0 %
Eosinophils Absolute: 0 10*3/uL (ref 0.0–0.5)
Eosinophils Relative: 0 %
HCT: 31.4 % — ABNORMAL LOW (ref 36.0–46.0)
Hemoglobin: 10.5 g/dL — ABNORMAL LOW (ref 12.0–15.0)
Immature Granulocytes: 1 %
Lymphocytes Relative: 11 %
Lymphs Abs: 1.8 10*3/uL (ref 0.7–4.0)
MCH: 27.3 pg (ref 26.0–34.0)
MCHC: 33.4 g/dL (ref 30.0–36.0)
MCV: 81.8 fL (ref 80.0–100.0)
Monocytes Absolute: 1.6 10*3/uL — ABNORMAL HIGH (ref 0.1–1.0)
Monocytes Relative: 10 %
Neutro Abs: 12.8 10*3/uL — ABNORMAL HIGH (ref 1.7–7.7)
Neutrophils Relative %: 78 %
Platelets: 165 10*3/uL (ref 150–400)
RBC: 3.84 MIL/uL — ABNORMAL LOW (ref 3.87–5.11)
RDW: 13.8 % (ref 11.5–15.5)
WBC: 16.4 10*3/uL — ABNORMAL HIGH (ref 4.0–10.5)
nRBC: 0 % (ref 0.0–0.2)

## 2020-01-15 LAB — DIC (DISSEMINATED INTRAVASCULAR COAGULATION)PANEL
D-Dimer, Quant: 3.83 ug/mL-FEU — ABNORMAL HIGH (ref 0.00–0.50)
Fibrinogen: 540 mg/dL — ABNORMAL HIGH (ref 210–475)
INR: 1.2 (ref 0.8–1.2)
Platelets: 189 10*3/uL (ref 150–400)
Prothrombin Time: 14.4 seconds (ref 11.4–15.2)
Smear Review: NONE SEEN
aPTT: 26 seconds (ref 24–36)

## 2020-01-15 LAB — CBC
HCT: 27.1 % — ABNORMAL LOW (ref 36.0–46.0)
HCT: 28.2 % — ABNORMAL LOW (ref 36.0–46.0)
Hemoglobin: 9.6 g/dL — ABNORMAL LOW (ref 12.0–15.0)
Hemoglobin: 10 g/dL — ABNORMAL LOW (ref 12.0–15.0)
MCH: 27.8 pg (ref 26.0–34.0)
MCH: 27.2 pg (ref 26.0–34.0)
MCHC: 35.4 g/dL (ref 30.0–36.0)
MCHC: 35.5 g/dL (ref 30.0–36.0)
MCV: 78.6 fL — ABNORMAL LOW (ref 80.0–100.0)
MCV: 76.6 fL — ABNORMAL LOW (ref 80.0–100.0)
Platelets: 193 10*3/uL (ref 150–400)
Platelets: 184 10*3/uL (ref 150–400)
RBC: 3.45 MIL/uL — ABNORMAL LOW (ref 3.87–5.11)
RBC: 3.68 MIL/uL — ABNORMAL LOW (ref 3.87–5.11)
RDW: 13.5 % (ref 11.5–15.5)
RDW: 13.3 % (ref 11.5–15.5)
WBC: 9.5 10*3/uL (ref 4.0–10.5)
WBC: 17.5 10*3/uL — ABNORMAL HIGH (ref 4.0–10.5)
nRBC: 0 % (ref 0.0–0.2)
nRBC: 0 % (ref 0.0–0.2)

## 2020-01-15 LAB — PROTEIN / CREATININE RATIO, URINE
Creatinine, Urine: 56.4 mg/dL
Total Protein, Urine: <6 mg/dL

## 2020-01-15 LAB — URIC ACID: Uric Acid, Serum: 4.9 mg/dL (ref 2.5–7.1)

## 2020-01-15 LAB — LACTATE DEHYDROGENASE: LDH: 409 U/L — ABNORMAL HIGH (ref 98–192)

## 2020-01-15 MED ORDER — ACETAMINOPHEN 500 MG PO TABS
1000.0000 mg | ORAL_TABLET | Freq: Once | ORAL | Status: AC
  Administered 2020-01-15: 1000 mg via ORAL
  Filled 2020-01-15: qty 2

## 2020-01-15 MED ORDER — METHYLERGONOVINE MALEATE 0.2 MG/ML IJ SOLN
0.2000 mg | Freq: Once | INTRAMUSCULAR | Status: AC
  Administered 2020-01-14: 0.2 mg via INTRAMUSCULAR

## 2020-01-15 MED ORDER — IBUPROFEN 600 MG PO TABS
600.0000 mg | ORAL_TABLET | Freq: Four times a day (QID) | ORAL | Status: DC
  Administered 2020-01-15 – 2020-01-16 (×6): 600 mg via ORAL
  Filled 2020-01-15 (×6): qty 1

## 2020-01-15 MED ORDER — ONDANSETRON HCL 4 MG PO TABS: 4.0000 mg | ORAL_TABLET | ORAL | Status: DC | PRN

## 2020-01-15 MED ORDER — PRENATAL MULTIVITAMIN CH
1.0000 | ORAL_TABLET | Freq: Every day | ORAL | Status: DC
  Administered 2020-01-15 – 2020-01-16 (×2): 1 via ORAL
  Filled 2020-01-15 (×2): qty 1

## 2020-01-15 MED ORDER — TETANUS-DIPHTH-ACELL PERTUSSIS 5-2.5-18.5 LF-MCG/0.5 IM SUSP: 0.5000 mL | Freq: Once | INTRAMUSCULAR | Status: DC

## 2020-01-15 MED ORDER — OXYCODONE HCL 5 MG PO TABS
5.0000 mg | ORAL_TABLET | Freq: Four times a day (QID) | ORAL | Status: DC | PRN
  Administered 2020-01-15 – 2020-01-16 (×2): 5 mg via ORAL
  Filled 2020-01-15 (×2): qty 1

## 2020-01-15 MED ORDER — METHYLERGONOVINE MALEATE 0.2 MG/ML IJ SOLN: 0.2000 mg | INTRAMUSCULAR | Status: DC | PRN

## 2020-01-15 MED ORDER — SENNOSIDES-DOCUSATE SODIUM 8.6-50 MG PO TABS
2.0000 | ORAL_TABLET | ORAL | Status: DC
  Filled 2020-01-15: qty 2

## 2020-01-15 MED ORDER — ONDANSETRON HCL 4 MG/2ML IJ SOLN: 4.0000 mg | INTRAMUSCULAR | Status: DC | PRN

## 2020-01-15 MED ORDER — MISOPROSTOL 200 MCG PO TABS
ORAL_TABLET | ORAL | Status: AC
  Filled 2020-01-15: qty 1

## 2020-01-15 MED ORDER — COCONUT OIL OIL
1.0000 "application " | TOPICAL_OIL | Status: DC | PRN
  Administered 2020-01-16: 1 via TOPICAL

## 2020-01-15 MED ORDER — ACETAMINOPHEN 325 MG PO TABS
650.0000 mg | ORAL_TABLET | ORAL | Status: DC | PRN
  Administered 2020-01-15: 650 mg via ORAL
  Filled 2020-01-15: qty 2

## 2020-01-15 MED ORDER — LACTATED RINGERS IV SOLN: INTRAVENOUS | Status: DC

## 2020-01-15 MED ORDER — SIMETHICONE 80 MG PO CHEW: 80.0000 mg | CHEWABLE_TABLET | ORAL | Status: DC | PRN

## 2020-01-15 MED ORDER — MISOPROSTOL 200 MCG PO TABS
400.0000 ug | ORAL_TABLET | Freq: Once | ORAL | Status: AC
  Administered 2020-01-14: 400 ug via ORAL

## 2020-01-15 MED ORDER — TRANEXAMIC ACID-NACL 1000-0.7 MG/100ML-% IV SOLN
1000.0000 mg | INTRAVENOUS | Status: AC
  Administered 2020-01-14: 1000 mg via INTRAVENOUS

## 2020-01-15 MED ORDER — MISOPROSTOL 200 MCG PO TABS
200.0000 ug | ORAL_TABLET | Freq: Once | ORAL | Status: AC
  Administered 2020-01-14: 200 ug via RECTAL

## 2020-01-15 MED ORDER — WITCH HAZEL-GLYCERIN EX PADS: 1.0000 "application " | MEDICATED_PAD | CUTANEOUS | Status: DC | PRN

## 2020-01-15 MED ORDER — ZOLPIDEM TARTRATE 5 MG PO TABS: 5.0000 mg | ORAL_TABLET | Freq: Every evening | ORAL | Status: DC | PRN

## 2020-01-15 MED ORDER — BENZOCAINE-MENTHOL 20-0.5 % EX AERO: 1.0000 "application " | INHALATION_SPRAY | CUTANEOUS | Status: DC | PRN

## 2020-01-15 MED ORDER — DIPHENHYDRAMINE HCL 25 MG PO CAPS: 25.0000 mg | ORAL_CAPSULE | Freq: Four times a day (QID) | ORAL | Status: DC | PRN

## 2020-01-15 MED ORDER — METHYLERGONOVINE MALEATE 0.2 MG PO TABS
0.2000 mg | ORAL_TABLET | ORAL | Status: DC | PRN
  Administered 2020-01-15: 0.2 mg via ORAL
  Filled 2020-01-15: qty 1

## 2020-01-15 MED ORDER — DIBUCAINE (PERIANAL) 1 % EX OINT: 1.0000 "application " | TOPICAL_OINTMENT | CUTANEOUS | Status: DC | PRN

## 2020-01-15 MED ORDER — TRANEXAMIC ACID-NACL 1000-0.7 MG/100ML-% IV SOLN: 1000.0000 mg | Freq: Once | INTRAVENOUS | Status: DC | PRN

## 2020-01-15 NOTE — Anesthesia Postprocedure Evaluation (Signed)
Anesthesia Post Note  Patient: [name]  Procedure(s) Performed: AN AD HOC LABOR EPIDURAL     Patient location during evaluation: Mother Baby Anesthesia Type: Epidural Level of consciousness: awake and alert Pain management: pain level controlled Vital Signs Assessment: post-procedure vital signs reviewed and stable Respiratory status: spontaneous breathing, nonlabored ventilation and respiratory function stable Cardiovascular status: stable Postop Assessment: no headache, no backache and epidural receding Anesthetic complications: no Comments: Pt complaining to CRNA this AM about residual weakness in RLE. Upon exam she has essentially normal strength and feels like it continues to improve. She says the last place to feel the numbness is her foot. She was able to stand at bedside this AM and is planning on ambulation after noon. I explained that it may simply still be medication effect, but rarely it can be from nerve stretch due to her positioning during delivery.    No complications documented.  Last Vitals:  Vitals:   01/15/20 0749 01/15/20 0750  BP: (!) 139/101   Pulse: (!) 102   Resp: 20   Temp: 37.1 C   SpO2: 98% 98%    Last Pain:  Vitals:   01/15/20 0900  TempSrc:   PainSc: 0-No pain   Pain Goal: Patients Stated Pain Goal: 3 (01/15/20 0900)                 Lewie Loron

## 2020-01-15 NOTE — Progress Notes (Signed)
Post vaginal delivery, patient current EBL is approximately 1700cc Dorisann Frames CNM, Ivonne Andrew CNM, RROB, CRNA, Anesthesiologist Finucane MD all at bedside.  OR updated and ready if needed. Ozan MD called and is coming in. Code Hemorrhage called.

## 2020-01-15 NOTE — Progress Notes (Signed)
Pt resting in bed. Fundus is firm and bleeding is small. Lab at the bedside for CBC. Will continue to monitor.

## 2020-01-15 NOTE — Progress Notes (Signed)
Subjective: Postpartum Day # 1 : S/P NSVD due to IOL for polyhydramnios, pt progressed Cytotec, foley bulb and pitocin with AROM. HAD NSVD on 9/5 with PPH QBl was 2535ms, given pitocin, methergine, Hemabate, cytotex and TXA, starting hgb was 12.2 dropped at time of PPH 9.6, after PPH was 10, plans to recheck in AM, asymptomatic. Patient up ad lib, denies syncope or dizziness. Reports consuming regular diet without issues and denies N/V. Patient reports 0 bowel movement + passing flatus.  Denies issues with urination due to having foley still ion place for not having complete feeling in legs after epidural and PPH and reports bleeding is "lighter."  Patient is breastfeeding and reports going well.  Desires undecided for postpartum contraception.  Pain is being appropriately managed with use of po meds. Pt has h/o PreE but no HTN episodes or issues with this pregnancy, pt currently denies HA, RUQ pain or vision changes. Elevated BP of 130-90-100s today, and last night BP was increased in 140/90s, therefore [facility] placed now.   None laceration Feeding:  Breast Contraceptive plan:  Undecided BB: Circ Ip Pt desired tomorrow prior to discharge.   Objective: Vital signs in last 24 hours: Patient Vitals for the past 24 hrs:  BP Temp Temp src Pulse Resp SpO2  01/15/20 0750 -- -- -- -- -- 98 %  01/15/20 0749 (!) 139/101 98.7 F (37.1 C) Oral (!) 102 20 98 %  01/15/20 0649 (!) 134/94 -- -- 89 18 --  01/15/20 0646 -- 98.6 F (37 C) Oral -- -- --  01/15/20 0310 137/89 -- -- (!) 107 -- --  01/15/20 0201 (!) 145/79 -- -- (!) 122 -- --  01/15/20 0131 (!) 150/99 -- -- (!) 113 16 --  01/15/20 0125 -- -- -- -- -- 96 %  01/15/20 0120 -- -- -- -- -- 97 %  01/15/20 0115 -- -- -- -- -- 98 %  01/15/20 0110 -- -- -- -- -- 95 %  01/15/20 0105 -- -- -- -- -- 95 %  01/15/20 0100 -- -- -- -- -- 97 %  01/15/20 0055 -- -- -- -- -- 94 %  01/15/20 0051 (!) 153/87 -- -- (!) 118 16 --  01/15/20  0050 -- -- -- -- -- 95 %  01/15/20 0048 (!) 142/81 (!) 101.2 F (38.4 C) Axillary (!) 126 16 --  01/15/20 0045 -- -- -- -- -- 97 %  01/15/20 0041 -- -- -- (!) 128 -- --  01/15/20 0040 -- -- -- -- -- 99 %  01/15/20 0037 (!) 128/57 -- -- (!) 121 16 --  01/15/20 0035 -- -- -- -- -- 98 %  01/15/20 0032 -- -- -- (!) 136 -- --  01/15/20 0030 -- -- -- -- -- 98 %  01/15/20 0026 -- -- -- (!) 137 -- --  01/15/20 0025 -- -- -- -- -- 99 %  01/15/20 0024 -- -- -- (!) 126 -- --  01/15/20 0020 -- -- -- -- -- 100 %  01/15/20 0016 (!) 135/91 -- -- 89 16 --  01/15/20 0015 -- -- -- -- -- 99 %  01/15/20 0011 112/69 -- -- (!) 115 16 --  01/15/20 0010 -- -- -- -- -- 99 %  01/15/20 0006 110/64 -- -- (!) 179 16 --  01/15/20 0005 -- -- -- -- -- 99 %  01/15/20 0002 91/66 -- -- (!) 125 16 --  01/15/20 0000 -- -- -- -- -- 98 %  01/14/20 2359 (!) 116/93 -- -- (!) 117 16 --  01/14/20 2355 -- -- -- -- -- 96 %  01/14/20 2351 118/89 -- -- (!) 128 16 --  01/14/20 2350 -- -- -- -- -- 100 %  01/14/20 2347 (!) 130/97 -- -- (!) 106 16 --  01/14/20 2345 -- -- -- -- -- 99 %  01/14/20 2341 (!) 118/91 -- -- (!) 111 -- --  01/14/20 2340 -- -- -- -- -- 93 %  01/14/20 2337 131/83 -- -- (!) 111 19 --  01/14/20 2335 -- -- -- -- -- 94 %  01/14/20 2331 -- -- -- (!) 137 -- --  01/14/20 2330 -- -- -- -- -- 94 %  01/14/20 2326 -- -- -- (!) 126 -- --  01/14/20 2325 -- -- -- -- -- 93 %  01/14/20 2320 -- -- -- -- -- 95 %  01/14/20 2319 (!) 141/78 -- -- (!) 109 20 --  01/14/20 2315 -- -- -- -- -- 94 %  01/14/20 2302 122/70 -- -- (!) 125 20 --  01/14/20 2240 -- -- -- -- -- 94 %  01/14/20 2235 -- -- -- -- -- 99 %  01/14/20 2230 -- -- -- -- -- 97 %  01/14/20 2225 -- -- -- -- -- 98 %  01/14/20 2215 -- -- -- -- -- 100 %  01/14/20 2210 -- -- -- -- -- 100 %  01/14/20 2205 -- -- -- -- -- 97 %  01/14/20 2201 118/82 -- -- (!) 129 17 --  01/14/20 2200 -- -- -- -- -- 98 %  01/14/20 2155 -- -- -- -- -- 98 %  01/14/20 2150 -- 98.6 F  (37 C) Axillary -- -- 99 %  01/14/20 2145 -- -- -- -- -- 99 %  01/14/20 2140 -- -- -- -- -- 97 %  01/14/20 2131 117/63 -- -- (!) 119 18 --  01/14/20 2101 135/78 -- -- (!) 121 17 --  01/14/20 2031 117/60 -- -- (!) 111 16 --  01/14/20 2004 121/62 99 F (37.2 C) Oral (!) 120 16 --  01/14/20 1931 109/67 -- -- 84 17 --  01/14/20 1901 (!) 123/58 -- -- 87 16 --  01/14/20 1831 (!) 130/101 99.1 F (37.3 C) Oral -- 16 --  01/14/20 1801 115/75 -- -- 93 16 --  01/14/20 1731 126/75 -- -- (!) 101 16 --  01/14/20 1719 -- -- -- -- 16 --  01/14/20 1717 126/70 -- -- 83 16 --  01/14/20 1711 (!) 127/91 -- -- 87 16 --  01/14/20 1706 (!) 123/101 -- -- (!) 101 16 --  01/14/20 1705 -- -- -- -- -- 98 %  01/14/20 1701 126/89 97.7 F (36.5 C) Oral 94 16 --  01/14/20 1700 -- -- -- -- -- 99 %  01/14/20 1657 129/88 -- -- 98 16 --  01/14/20 1655 -- -- -- -- -- 99 %  01/14/20 1651 135/83 -- -- 89 16 --  01/14/20 1650 -- -- -- -- -- 98 %  01/14/20 1649 132/82 -- -- (!) 101 16 --  01/14/20 1642 138/87 -- -- (!) 102 16 --  01/14/20 1640 -- -- -- -- -- 99 %  01/14/20 1636 (!) 136/101 -- -- (!) 107 16 --  01/14/20 1610 133/88 -- -- 97 16 --  01/14/20 1437 130/85 -- -- 86 16 --  01/14/20 1349 (!) 139/91 -- -- 91 16 --  01/14/20 1317 119/65 98.1 F (36.7 C) Oral 83 16 --  01/14/20 1045 131/84 -- -- 85 16 --     Physical Exam:  General: alert, cooperative, appears stated age and no distress Mood/Affect: Happy Lungs: clear to auscultation, no wheezes, rales or rhonchi, symmetric air entry.  Heart: normal rate, regular rhythm, normal S1, S2, no murmurs, rubs, clicks or gallops. Breast: breasts appear normal, no suspicious masses, no skin or nipple changes or axillary nodes. Abdomen:  + bowel sounds, soft, non-tender GU: perineum intact, healing well. No signs of external hematomas.  Uterine Fundus: firm Lochia: appropriate Skin: Warm, Dry. DVT Evaluation: No evidence of DVT seen on physical exam. Negative  Homan's sign. No cords or calf tenderness. No significant calf/ankle edema.  CBC Latest Ref Rng & Units 01/15/2020 01/14/2020 01/14/2020  WBC 4.0 - 10.5 K/uL 17.5(H) - 9.5  Hemoglobin 12.0 - 15.0 g/dL 10.0(L) - 9.6(L)  Hematocrit 36 - 46 % 28.2(L) - 27.1(L)  Platelets 150 - 400 K/uL 184 189 193    Results for orders placed or performed during the hospital encounter of 01/14/20 (from the past 24 hour(s))  Prepare RBC (crossmatch)     Status: None   Collection Time: 01/14/20 11:28 PM  Result Value Ref Range   Order Confirmation      ORDER PROCESSED BY BLOOD BANK Performed at [facility], [place] [address], [place], [place] [address]   CBC     Status: Abnormal   Collection Time: 01/14/20 11:39 PM  Result Value Ref Range   WBC 9.5 4.0 - 10.5 K/uL   RBC 3.45 (L) 3.87 - 5.11 MIL/uL   Hemoglobin 9.6 (L) 12.0 - 15.0 g/dL   HCT 27.1 (L) 36 - 46 %   MCV 78.6 (L) 80.0 - 100.0 fL   MCH 27.8 26.0 - 34.0 pg   MCHC 35.4 30.0 - 36.0 g/dL   RDW 13.5 11.5 - 15.5 %   Platelets 193 150 - 400 K/uL   nRBC 0.0 0.0 - 0.2 %  DIC Panel (Not at [facility]) ONCE - STAT     Status: Abnormal   Collection Time: 01/14/20 11:40 PM  Result Value Ref Range   Prothrombin Time 14.4 11.4 - 15.2 seconds   INR 1.2 0.8 - 1.2   aPTT 26 24 - 36 seconds   Fibrinogen 540 (H) 210 - 475 mg/dL   D-Dimer, Quant 3.83 (H) 0.00 - 0.50 ug/mL-FEU   Platelets 189 150 - 400 K/uL   Smear Review NO SCHISTOCYTES SEEN   CBC     Status: Abnormal   Collection Time: 01/15/20  5:16 AM  Result Value Ref Range   WBC 17.5 (H) 4.0 - 10.5 K/uL   RBC 3.68 (L) 3.87 - 5.11 MIL/uL   Hemoglobin 10.0 (L) 12.0 - 15.0 g/dL   HCT 28.2 (L) 36 - 46 %   MCV 76.6 (L) 80.0 - 100.0 fL   MCH 27.2 26.0 - 34.0 pg   MCHC 35.5 30.0 - 36.0 g/dL   RDW 13.3 11.5 - 15.5 %   Platelets 184 150 - 400 K/uL   nRBC 0.0 0.0 - 0.2 %     CBG (last 3)  No results for input(s): GLUCAP in the last 72 hours.   I/O last 3 completed shifts: In: 313.2 [I.V.:313.2] Out:  4300 [Urine:1800; Blood:2500]   Assessment Postpartum Day # 1 : S/P NSVD due to IOL for polyhydramnios, pt progressed Cytotec, foley bulb and pitocin with AROM. HAD NSVD on 9/5 with PPH QBl was 2551ms, given pitocin, methergine, Hemabate, cytotex  and TXA, starting hgb was 12.2 dropped at time of PPH 9.6, after Long was 10, plans to recheck in AM, asymptomatic. Pt has h/o PreE but no HTN episodes or issues with this pregnancy, pt currently denies HA, RUQ pain or vision changes. Elevated BP of 130-90-100s today, and last night BP was increased in 140/90s, therefore pt met criteria for [facility], labs placed now. Pt stable. -1 involution. breastfeeding. Hemodynamically stable.   Plan: Continue other mgmt as ordered [place] with anemia: Monitor CBC new labs draw Now then again on 9/7 GHTN: Newly Dx, monitor BP, Labs Pending now. OF BP>140/90 will start procardia 30XL mg PO daily. If SR BP and neuro s/sx develop will start IV hydralazine protocol with magnesium sulfate IV.  VTE prophylactics: Early ambulated as tolerates.  Pain control: Motrin/Tylenol PRN Education given regarding options for contraception, including barrier methods, injectable contraception, IUD placement, oral contraceptives.  Plan for discharge tomorrow, Breastfeeding, Lactation consult and Circumcision prior to discharge   Dr. Nelda Marseille updated on patient status  [employer] NP-C, [place] 01/15/2020, 8:06 AM

## 2020-01-15 NOTE — Lactation Note (Signed)
This note was copied from a baby's chart. Lactation Consultation Note  Patient Name: [name] Today's Date: 01/15/2020   [facility] checked with RN, Raynelle Fanning, to see if [name] would like to be assisted by lactation today. RN stated that she would ask [name] and call lactation as needed.   Walker Shadow 01/15/2020, 10:06 AM

## 2020-01-15 NOTE — Progress Notes (Signed)
Patient says Lactation Consultation is not needed at this time.  She agrees to notify RN if she changes her mind.

## 2020-01-16 LAB — COMPREHENSIVE METABOLIC PANEL
ALT: 20 U/L (ref 0–44)
AST: 26 U/L (ref 15–41)
Albumin: 2 g/dL — ABNORMAL LOW (ref 3.5–5.0)
Alkaline Phosphatase: 111 U/L (ref 38–126)
Anion gap: 7 (ref 5–15)
BUN: 5 mg/dL — ABNORMAL LOW (ref 6–20)
CO2: 24 mmol/L (ref 22–32)
Calcium: 8.5 mg/dL — ABNORMAL LOW (ref 8.9–10.3)
Chloride: 104 mmol/L (ref 98–111)
Creatinine, Ser: 0.67 mg/dL (ref 0.44–1.00)
GFR calc Af Amer: >60 mL/min (ref >60)
GFR calc non Af Amer: >60 mL/min (ref >60)
Glucose, Bld: 106 mg/dL — ABNORMAL HIGH (ref 70–99)
Potassium: 3.5 mmol/L (ref 3.5–5.1)
Sodium: 135 mmol/L (ref 135–145)
Total Bilirubin: 0.5 mg/dL (ref 0.3–1.2)
Total Protein: 4.9 g/dL — ABNORMAL LOW (ref 6.5–8.1)

## 2020-01-16 LAB — CBC
HCT: 24.5 % — ABNORMAL LOW (ref 36.0–46.0)
Hemoglobin: 8.5 g/dL — ABNORMAL LOW (ref 12.0–15.0)
MCH: 26.9 pg (ref 26.0–34.0)
MCHC: 34.7 g/dL (ref 30.0–36.0)
MCV: 77.5 fL — ABNORMAL LOW (ref 80.0–100.0)
Platelets: 193 10*3/uL (ref 150–400)
RBC: 3.16 MIL/uL — ABNORMAL LOW (ref 3.87–5.11)
RDW: 13.6 % (ref 11.5–15.5)
WBC: 11 10*3/uL — ABNORMAL HIGH (ref 4.0–10.5)
nRBC: 0 % (ref 0.0–0.2)

## 2020-01-16 MED ORDER — SODIUM CHLORIDE 0.9 % IV SOLN
510.0000 mg | Freq: Once | INTRAVENOUS | Status: AC
  Administered 2020-01-16: 510 mg via INTRAVENOUS
  Filled 2020-01-16: qty 17

## 2020-01-16 MED ORDER — POLYSACCHARIDE IRON COMPLEX 150 MG PO CAPS: 150.0000 mg | ORAL_CAPSULE | Freq: Every day | ORAL | Status: DC

## 2020-01-16 MED ORDER — MAGNESIUM OXIDE -MG SUPPLEMENT 400 (240 MG) MG PO TABS: 400.0000 mg | ORAL_TABLET | Freq: Every day | ORAL | Status: DC

## 2020-01-16 MED ORDER — POLYSACCHARIDE IRON COMPLEX 150 MG PO CAPS
150.0000 mg | ORAL_CAPSULE | Freq: Every day | ORAL | Status: DC
  Administered 2020-01-16: 150 mg via ORAL
  Filled 2020-01-16: qty 1

## 2020-01-16 MED ORDER — COCONUT OIL OIL: 1.0000 "application " | TOPICAL_OIL | 0 refills | Status: DC | PRN

## 2020-01-16 MED ORDER — ACETAMINOPHEN 500 MG PO TABS: 1000.0000 mg | ORAL_TABLET | Freq: Four times a day (QID) | ORAL | 2 refills | Status: AC | PRN

## 2020-01-16 MED ORDER — MAGNESIUM OXIDE 400 (241.3 MG) MG PO TABS
400.0000 mg | ORAL_TABLET | Freq: Every day | ORAL | Status: DC
  Administered 2020-01-16: 400 mg via ORAL
  Filled 2020-01-16: qty 1

## 2020-01-16 MED ORDER — BENZOCAINE-MENTHOL 20-0.5 % EX AERO: 1.0000 "application " | INHALATION_SPRAY | CUTANEOUS | Status: DC | PRN

## 2020-01-16 NOTE — Progress Notes (Signed)
Discharge paperwork given, follow-up appointment discussed, medications discussed, and pt verbalized understanding

## 2020-01-16 NOTE — Lactation Note (Signed)
This note was copied from a baby's chart. Lactation Consultation Note  Patient Name: [name] Today's Date: 01/16/2020 Reason for consult: Initial assessment;Mother's request;Nipple pain/trauma;Term;Maternal endocrine disorder Type of Endocrine Disorder?: PCOS  Visited with [name] on day of probable discharge.  Baby at 37 hrs old and 3% weight loss.    Mom had initially declined Lactation Consult, but requested one today.  Mom states baby is latching deeply (she bf her 78 month old for 10 months), but would love Comfort Gels to go home with.  Instructed on use.  Encouraged Mom to hand express some colostrum onto nipples and let soak in prior to placing Comfort Gels on.  Mom denies blistering or cracks on nipples. Latch scores given by RNs have been 9 and 10.  Encouraged STS and cue based feedings.  Mom states she just had breastfed baby and was burping him in her lap.  Baby has been getting some formula, and a pump is set up at bedside.  Reviewed importance of disassembling pump parts, washing, rinsing and air drying in separate bin that LC provided.  Mom has 3 DEBPs at home.  Engorgement prevention and treatment reviewed.  Lactation brochure given to Mom and identified phone numbers for follow-up phone consult and OP lactation appt.  Mom aware of OP lactation support, and encouraged to call prn. Feeding Feeding Type: Breast Fed  LATCH Score Latch: Grasps breast easily, tongue down, lips flanged, rhythmical sucking.  Audible Swallowing: Spontaneous and intermittent  Type of Nipple: Everted at rest and after stimulation  Comfort (Breast/Nipple): Soft / non-tender  Hold (Positioning): No assistance needed to correctly position infant at breast.  LATCH Score: 10  Interventions Interventions: Breast feeding basics reviewed;Skin to skin;Breast massage;Hand express;DEBP;Hand pump  Lactation Tools Discussed/Used Tools: Pump;Bottle Breast pump type: Double-Electric  Breast Pump Pump Review: Setup, frequency, and cleaning;Milk Storage Initiated by:: OBSC RN Date initiated:: 01/15/20   Consult Status Consult Status: Complete Date: 01/16/20 Follow-up type: Call as needed    [name] 01/16/2020, 11:45 AM

## 2020-01-16 NOTE — Discharge Summary (Signed)
OB Discharge Summary  Patient Name: [name] DOB: 10/08/1995 MRN: [id number]  Date of admission: 01/14/2020 Delivering provider: Dorisann Frames K   Admitting diagnosis: Polyhydramnios [O40.9XX0] Intrauterine pregnancy: [redacted]w[redacted]d     Secondary diagnosis: Patient Active Problem List   Diagnosis Date Noted  . SVD (spontaneous vaginal delivery) 01/15/2020  . PPH (postpartum hemorrhage) 01/15/2020  . Anemia due to acute blood loss 01/15/2020  . Gestational hypertension 01/15/2020  . Polyhydramnios 01/14/2020  . Postpartum care following vaginal delivery 9/6 01/14/2020   Additional problems:none   Date of discharge: 01/16/2020   Discharge diagnosis: Principal Problem:   Postpartum care following vaginal delivery 9/6 Active Problems:   Polyhydramnios   SVD (spontaneous vaginal delivery)   PPH (postpartum hemorrhage)   Anemia due to acute blood loss   Gestational hypertension                                                              Post partum procedures:IV iron  Augmentation: AROM, Pitocin, Cytotec and IP Foley Pain control: Epidural  Laceration:None  Episiotomy:None  Complications: Hemorrhage>1024mL  Hospital course:  Induction of Labor With Vaginal Delivery   24 y.o. yo G2P2002 at [redacted]w[redacted]d was admitted to the hospital 01/14/2020 for induction of labor.  Indication for induction: Gestational hypertension.  Patient had a labor course as follows: Membrane Rupture Time/Date: 4:00 PM ,01/14/2020   Delivery Method:Vaginal, Spontaneous  Episiotomy: None  Lacerations:  None    Placenta deliver spontaneously. No lacerations noted. Small trickle noted with uterine atony. Methergine given. Trickle continued. Manual exploration of the uterus performed with lower uterine segment atony and expression of several large clots. Cytotec 800mg  given. Repeat uterine sweep after 2 minutes with continued trickling and clots build-up noted. Continued fundal massage. Pt medicated with Fentanyl x 2,  10 minutes apart. TXA added 1 gram and Hemabate given along with Lomotil. Uterine atony persisted despite measures. Dr. paged to come for bedside consult and in route. Code hemorrhage called. Bakri balloon attempt initiated. Dr. Charlotta Newton arrived to room and continued Bakri balloon placement attempt. Anesthesia at the bedside and medicating patient for comfort. Stat labs ordered, CBC and DIC panel now. Will repeat in 6 hours. Will give 2 liters LR for volume replacement for a total fluid volume of 2500ccs.  Details of delivery can be found in separate delivery note.  Patient had a routine postpartum course. She received intravenous iron prior to discharge. Patient is discharged home 01/16/20.  Newborn Data: Birth date:01/14/2020  Birth time:10:44 PM  Gender:Female  Living status:Living  Apgars:5 ,9  Weight:3710 g   Physical exam  Vitals:   01/15/20 2150 01/16/20 0357 01/16/20 0810 01/16/20 0812  BP: (!) 142/90 119/81  122/75  Pulse: (!) 103 87  75  Resp: 18 18  17   Temp:  98 F (36.7 C) 97.7 F (36.5 C) (!) 97.4 F (36.3 C)  TempSrc: Oral Oral Oral Oral  SpO2: 100% 99%  99%  Weight:      Height:       General: alert, cooperative and no distress Lochia: appropriate Uterine Fundus: firm Perineum: intact DVT Evaluation: No cords or calf tenderness. Calf/Ankle edema is present Labs: Lab Results  Component Value Date   WBC 11.0 (H) 01/16/2020   HGB 8.5 (L) 01/16/2020  HCT 24.5 (L) 01/16/2020   MCV 77.5 (L) 01/16/2020   PLT 193 01/16/2020   CMP Latest Ref Rng & Units 01/16/2020  Glucose 70 - 99 mg/dL 878(M)  BUN 6 - 20 mg/dL 5(L)  Creatinine 7.67 - 1.00 mg/dL 2.09  Sodium 470 - 962 mmol/L 135  Potassium 3.5 - 5.1 mmol/L 3.5  Chloride 98 - 111 mmol/L 104  CO2 22 - 32 mmol/L 24  Calcium 8.9 - 10.3 mg/dL 8.3(M)  Total Protein 6.5 - 8.1 g/dL 4.9(L)  Total Bilirubin 0.3 - 1.2 mg/dL 0.5  Alkaline Phos 38 - 126 U/L 111  AST 15 - 41 U/L 26  ALT 0 - 44 U/L 20   Edinburgh  Postnatal Depression Scale Screening Tool 01/15/2020 01/15/2020 09/14/2018  I have been able to laugh and see the funny side of things. 0 0 0  I have looked forward with enjoyment to things. 0 0 0  I have blamed myself unnecessarily when things went wrong. 1 1 1   I have been anxious or worried for no good reason. 0 0 0  I have felt scared or panicky for no good reason. 1 1 0  Things have been getting on top of me. 1 1 0  I have been so unhappy that I have had difficulty sleeping. 0 0 0  I have felt sad or miserable. 1 1 0  I have been so unhappy that I have been crying. 0 0 0  The thought of harming myself has occurred to me. 0 0 0  Edinburgh Postnatal Depression Scale Total 4 4 1    Vaccines: TDaP UTD         Flu    declined  Discharge instruction:  per After Visit Summary,  "Understanding Mother & Baby Care" hospital booklet  After Visit Meds:  Allergies as of 01/16/2020      Reactions   Latex Rash      Medication List    STOP taking these medications   ferrous sulfate 325 (65 FE) MG tablet   ibuprofen 600 MG tablet Commonly known as: ADVIL     TAKE these medications   acetaminophen 500 MG tablet Commonly known as: TYLENOL Take 2 tablets (1,000 mg total) by mouth every 6 (six) hours as needed.   benzocaine-Menthol 20-0.5 % Aero Commonly known as: DERMOPLAST Apply 1 application topically as needed for irritation (perineal discomfort).   coconut oil Oil Apply 1 application topically as needed.   iron polysaccharides 150 MG capsule Commonly known as: Ferrex 150 Take 1 capsule (150 mg total) by mouth daily.   Magnesium Oxide 400 (240 Mg) MG Tabs Take 1 tablet (400 mg total) by mouth daily. For prevention of constipation.   prenatal vitamin w/FE, FA 27-1 MG Tabs tablet Take 1 tablet by mouth daily at 12 noon.            Discharge Care Instructions  (From admission, onward)         Start     Ordered   01/16/20 0000  Discharge wound care:       Comments: Sitz  baths 2 times /day with warm water x 1 week. May add herbals: 1 ounce dried comfrey leaf* 1 ounce calendula flowers 1 ounce lavender flowers  Supplies can be found online at 03/17/2020 sources at 03/17/20, [employer]  1/2 ounce dried uva ursi leaves 1/2 ounce witch hazel blossoms (if you can find them) 1/2 ounce dried sage leaf 1/2 cup sea  salt Directions: Bring 2 quarts of water to a boil. Turn off heat, and place 1 ounce (approximately 1 large handful) of the above mixed herbs (not the salt) into the pot. Steep, covered, for 30 minutes.  Strain the liquid well with a fine mesh strainer, and discard the herb material. Add 2 quarts of liquid to the tub, along with the 1/2 cup of salt. This medicinal liquid can also be made into compresses and peri-rinses.   01/16/20 1010          Diet: iron rich diet  Activity: Advance as tolerated. Pelvic rest for 6 weeks.   Postpartum contraception: IUD Paragard planned at 8-12 wks postpartum, condoms in interim  Newborn Data: Live born female  Birth Weight: 8 lb 2.9 oz (3710 g) APGAR: 5, 9  Newborn Delivery   Birth date/time: 01/14/2020 22:44:00 Delivery type: Vaginal, Spontaneous      named Iylan Baby Feeding: Bottle and Breast Disposition:home with mother   Delivery Report:   Review the Delivery Report for details.    Follow up:  Follow-up Information    [facility]. Schedule an appointment as soon as possible for a visit in 1 week(s).   Specialty: Obstetrics and Gynecology Why: Blood pressure check Contact information: [address] [place] [address] [phone]                Signed: Cipriano Mile, MSN 01/16/2020, 10:15 AM

## 2020-01-16 NOTE — Discharge Instructions (Signed)
Lactation outpatient support - home visit ° ° °Linda Coppola °RN, MHA, IBCLC °at [facility]: Lactation Consultant ° °[email or web address] ° °

## 2020-01-16 NOTE — Plan of Care (Signed)
  Problem: Education: Goal: Knowledge of General Education information will improve Description: Including pain rating scale, medication(s)/side effects and non-pharmacologic comfort measures Outcome: Adequate for Discharge   Problem: Health Behavior/Discharge Planning: Goal: Ability to manage health-related needs will improve Outcome: Adequate for Discharge   Problem: Clinical Measurements: Goal: Ability to maintain clinical measurements within normal limits will improve Outcome: Adequate for Discharge Goal: Will remain free from infection Outcome: Adequate for Discharge Goal: Diagnostic test results will improve Outcome: Adequate for Discharge Goal: Respiratory complications will improve Outcome: Adequate for Discharge Goal: Cardiovascular complication will be avoided Outcome: Adequate for Discharge   Problem: Activity: Goal: Risk for activity intolerance will decrease Outcome: Adequate for Discharge   Problem: Nutrition: Goal: Adequate nutrition will be maintained Outcome: Adequate for Discharge   Problem: Coping: Goal: Level of anxiety will decrease Outcome: Adequate for Discharge   Problem: Elimination: Goal: Will not experience complications related to bowel motility Outcome: Adequate for Discharge Goal: Will not experience complications related to urinary retention Outcome: Adequate for Discharge   Problem: Pain Managment: Goal: General experience of comfort will improve Outcome: Adequate for Discharge   Problem: Safety: Goal: Ability to remain free from injury will improve Outcome: Adequate for Discharge   Problem: Skin Integrity: Goal: Risk for impaired skin integrity will decrease Outcome: Adequate for Discharge   Problem: Activity: Goal: Ability to tolerate increased activity will improve Outcome: Adequate for Discharge   Problem: Role Relationship: Goal: Ability to demonstrate positive interaction with newborn will improve Outcome: Adequate for  Discharge   Problem: Skin Integrity: Goal: Demonstration of wound healing without infection will improve Outcome: Adequate for Discharge

## 2020-01-17 LAB — TYPE AND SCREEN
ABO/RH(D): O POS
Antibody Screen: NEGATIVE
Unit division: 0
Unit division: 0

## 2020-01-17 LAB — BPAM RBC
Blood Product Expiration Date: 202110082359
Blood Product Expiration Date: 202110082359
ISSUE DATE / TIME: 202109052333
ISSUE DATE / TIME: 202109052333
Unit Type and Rh: 5100
Unit Type and Rh: 5100

## 2020-12-12 LAB — OB RESULTS CONSOLE GC/CHLAMYDIA
Chlamydia: NEGATIVE
Gonorrhea: NEGATIVE

## 2020-12-12 LAB — HEPATITIS C ANTIBODY: HCV Ab: NEGATIVE

## 2020-12-12 LAB — OB RESULTS CONSOLE HEPATITIS B SURFACE ANTIGEN: Hepatitis B Surface Ag: NEGATIVE

## 2020-12-12 LAB — OB RESULTS CONSOLE RUBELLA ANTIBODY, IGM: Rubella: IMMUNE

## 2021-02-25 ENCOUNTER — Other Ambulatory Visit: Payer: Self-pay | Admitting: Obstetrics and Gynecology

## 2021-02-25 DIAGNOSIS — Z3A19 19 weeks gestation of pregnancy: Secondary | ICD-10-CM

## 2021-02-25 DIAGNOSIS — Z363 Encounter for antenatal screening for malformations: Secondary | ICD-10-CM

## 2021-02-25 NOTE — Progress Notes (Unsigned)
wee

## 2021-03-03 ENCOUNTER — Encounter: Payer: Self-pay | Admitting: *Deleted

## 2021-03-04 ENCOUNTER — Other Ambulatory Visit: Payer: Self-pay | Admitting: *Deleted

## 2021-03-04 ENCOUNTER — Ambulatory Visit: Payer: Medicaid Other | Attending: Obstetrics and Gynecology

## 2021-03-04 ENCOUNTER — Other Ambulatory Visit: Payer: Self-pay | Admitting: Obstetrics and Gynecology

## 2021-03-04 ENCOUNTER — Other Ambulatory Visit: Payer: Self-pay

## 2021-03-04 DIAGNOSIS — Z363 Encounter for antenatal screening for malformations: Secondary | ICD-10-CM | POA: Diagnosis present

## 2021-03-04 DIAGNOSIS — Z3A19 19 weeks gestation of pregnancy: Secondary | ICD-10-CM

## 2021-03-04 DIAGNOSIS — O09892 Supervision of other high risk pregnancies, second trimester: Secondary | ICD-10-CM

## 2021-03-04 DIAGNOSIS — Z362 Encounter for other antenatal screening follow-up: Secondary | ICD-10-CM

## 2021-04-01 ENCOUNTER — Encounter: Payer: Self-pay | Admitting: *Deleted

## 2021-04-01 ENCOUNTER — Ambulatory Visit: Payer: Medicaid Other | Admitting: *Deleted

## 2021-04-01 ENCOUNTER — Other Ambulatory Visit: Payer: Self-pay

## 2021-04-01 ENCOUNTER — Other Ambulatory Visit: Payer: Self-pay | Admitting: *Deleted

## 2021-04-01 ENCOUNTER — Ambulatory Visit: Payer: Medicaid Other | Attending: Obstetrics and Gynecology

## 2021-04-01 VITALS — BP 127/76 | HR 72

## 2021-04-01 DIAGNOSIS — O09892 Supervision of other high risk pregnancies, second trimester: Secondary | ICD-10-CM | POA: Insufficient documentation

## 2021-04-01 DIAGNOSIS — Z362 Encounter for other antenatal screening follow-up: Secondary | ICD-10-CM | POA: Diagnosis present

## 2021-04-01 DIAGNOSIS — Z3A24 24 weeks gestation of pregnancy: Secondary | ICD-10-CM | POA: Diagnosis not present

## 2021-04-01 DIAGNOSIS — O09899 Supervision of other high risk pregnancies, unspecified trimester: Secondary | ICD-10-CM

## 2021-04-01 DIAGNOSIS — Z8759 Personal history of other complications of pregnancy, childbirth and the puerperium: Secondary | ICD-10-CM

## 2021-04-01 DIAGNOSIS — O99212 Obesity complicating pregnancy, second trimester: Secondary | ICD-10-CM | POA: Diagnosis not present

## 2021-04-01 DIAGNOSIS — O09299 Supervision of pregnancy with other poor reproductive or obstetric history, unspecified trimester: Secondary | ICD-10-CM

## 2021-04-01 DIAGNOSIS — Z6836 Body mass index (BMI) 36.0-36.9, adult: Secondary | ICD-10-CM

## 2021-04-21 LAB — OB RESULTS CONSOLE RPR: RPR: NONREACTIVE

## 2021-04-21 LAB — OB RESULTS CONSOLE HIV ANTIBODY (ROUTINE TESTING): HIV: NONREACTIVE

## 2021-04-29 ENCOUNTER — Ambulatory Visit: Payer: Medicaid Other | Admitting: *Deleted

## 2021-04-29 ENCOUNTER — Other Ambulatory Visit: Payer: Self-pay

## 2021-04-29 ENCOUNTER — Ambulatory Visit: Payer: Medicaid Other | Attending: Obstetrics and Gynecology

## 2021-04-29 ENCOUNTER — Other Ambulatory Visit: Payer: Self-pay | Admitting: *Deleted

## 2021-04-29 DIAGNOSIS — Z3A28 28 weeks gestation of pregnancy: Secondary | ICD-10-CM

## 2021-04-29 DIAGNOSIS — Z6836 Body mass index (BMI) 36.0-36.9, adult: Secondary | ICD-10-CM

## 2021-04-29 DIAGNOSIS — E669 Obesity, unspecified: Secondary | ICD-10-CM

## 2021-04-29 DIAGNOSIS — O09299 Supervision of pregnancy with other poor reproductive or obstetric history, unspecified trimester: Secondary | ICD-10-CM

## 2021-04-29 DIAGNOSIS — O09892 Supervision of other high risk pregnancies, second trimester: Secondary | ICD-10-CM

## 2021-04-29 DIAGNOSIS — O99213 Obesity complicating pregnancy, third trimester: Secondary | ICD-10-CM

## 2021-04-29 DIAGNOSIS — O09893 Supervision of other high risk pregnancies, third trimester: Secondary | ICD-10-CM | POA: Diagnosis not present

## 2021-04-29 DIAGNOSIS — O09899 Supervision of other high risk pregnancies, unspecified trimester: Secondary | ICD-10-CM

## 2021-04-29 DIAGNOSIS — Z8759 Personal history of other complications of pregnancy, childbirth and the puerperium: Secondary | ICD-10-CM

## 2021-04-29 DIAGNOSIS — O99212 Obesity complicating pregnancy, second trimester: Secondary | ICD-10-CM

## 2021-04-29 DIAGNOSIS — Z3689 Encounter for other specified antenatal screening: Secondary | ICD-10-CM

## 2021-04-29 DIAGNOSIS — O09293 Supervision of pregnancy with other poor reproductive or obstetric history, third trimester: Secondary | ICD-10-CM | POA: Diagnosis not present

## 2021-04-29 DIAGNOSIS — O09292 Supervision of pregnancy with other poor reproductive or obstetric history, second trimester: Secondary | ICD-10-CM

## 2021-05-11 NOTE — L&D Delivery Note (Signed)
Delivery Note ? ? ?Patient Name: [name] ?DOB: [birth year] ?MRN: [id number] ? ?Date of admission: 07/18/2021 ?Delivering MD: Dale [place] C  ?Date of delivery: 07/18/21 ?Type of delivery: SVD ? ?Newborn Data: ?Live born female  ?Birth Weight:   ?APGAR: 8, 9 ? ?Newborn Delivery   ?Birth date/time: 07/18/2021 17:45:00 ?Delivery type: Vaginal, Spontaneous ?  ?  ?Catherine M Witthuhn, 26 y.o., @ [redacted]w[redacted]d,  [id number], who was admitted for active labor, gbs+ received x2 doses amp, newly dx with preeclampsia without SF, BP currently 146/88 if severe range BP will transfer to ante and start magnesium ,asymptomatic, pcr was 0.30. I was called to the room when she progressed 2+ station in the second stage of labor.  She pushed for 5/min.  She delivered a viable infant, cephalic and restituted to the ROA position over an intact perineum.  A nuchal cord   was not identified. The baby was placed on maternal abdomen while initial step of NRP were perfmored (Dry, Stimulated, and warmed). Hat placed on baby for thermoregulation. Delayed cord clamping was performed for 2 minutes.  Cord double clamped and cut.  Cord cut by FOB. Apgar scores were 8 and 9. Prophylactic Pitocin was started in the third stage of labor for active management. The placenta delivered spontaneously, shultz, with a 3 vessel cord and was sent to LD.  Inspection revealed none. An examination of the vaginal vault and cervix was free from lacerations. The uterus was firm, bleeding stable. Uterus would become bogy and firm up with fundal massage, r/t h/o PPH TXA was also started and rectal Cytotec was given.    Placenta and umbilical artery blood gas were not sent.  There were no complications during the procedure.  Mom and baby skin to skin following delivery. Left in stable condition. ? ?Maternal Info: ?Anesthesia: Epidural ?Episiotomy: no ?Lacerations:  ontact ?Suture Repair: no ?Est. Blood Loss (mL):  ? ?Newborn Info: ? ?Baby Sex: female ?Circumcision: in pt  desired ? ?APGAR (1 MIN):  8 ?APGAR (5 MINS):  9 ?APGAR (10 MINS):   ? ? ?Mom to postpartum.  Baby to Couplet care / Skin to Skin. ? ?Dr Connye Burkitt updated. ? ?[facility], [place], NP-C ?07/18/21 ?6:22 PM ? ? ? ?  ?

## 2021-06-03 ENCOUNTER — Encounter: Payer: Self-pay | Admitting: *Deleted

## 2021-06-03 ENCOUNTER — Other Ambulatory Visit: Payer: Self-pay

## 2021-06-03 ENCOUNTER — Ambulatory Visit: Payer: BC Managed Care – PPO | Attending: Maternal & Fetal Medicine

## 2021-06-03 ENCOUNTER — Ambulatory Visit: Payer: BC Managed Care – PPO | Admitting: *Deleted

## 2021-06-03 VITALS — BP 123/75 | HR 82

## 2021-06-03 DIAGNOSIS — Z3689 Encounter for other specified antenatal screening: Secondary | ICD-10-CM | POA: Insufficient documentation

## 2021-06-03 DIAGNOSIS — O09892 Supervision of other high risk pregnancies, second trimester: Secondary | ICD-10-CM | POA: Diagnosis present

## 2021-06-03 DIAGNOSIS — O99212 Obesity complicating pregnancy, second trimester: Secondary | ICD-10-CM | POA: Diagnosis present

## 2021-06-03 DIAGNOSIS — O09293 Supervision of pregnancy with other poor reproductive or obstetric history, third trimester: Secondary | ICD-10-CM

## 2021-06-03 DIAGNOSIS — O99213 Obesity complicating pregnancy, third trimester: Secondary | ICD-10-CM | POA: Diagnosis not present

## 2021-06-03 DIAGNOSIS — Z3A32 32 weeks gestation of pregnancy: Secondary | ICD-10-CM

## 2021-06-03 DIAGNOSIS — Z6836 Body mass index (BMI) 36.0-36.9, adult: Secondary | ICD-10-CM | POA: Insufficient documentation

## 2021-06-03 DIAGNOSIS — E668 Other obesity: Secondary | ICD-10-CM | POA: Diagnosis not present

## 2021-06-03 DIAGNOSIS — O09292 Supervision of pregnancy with other poor reproductive or obstetric history, second trimester: Secondary | ICD-10-CM | POA: Diagnosis present

## 2021-06-03 NOTE — Progress Notes (Signed)
Patient states she has had RUQ aching with occas sharp pain. Was seen at [facility] and had labs. Has not received results. States BP was normal. No other sxs.

## 2021-06-17 LAB — OB RESULTS CONSOLE GBS: GBS: NEGATIVE

## 2021-06-28 LAB — OB RESULTS CONSOLE GBS: GBS: POSITIVE

## 2021-07-18 ENCOUNTER — Other Ambulatory Visit: Payer: Self-pay

## 2021-07-18 ENCOUNTER — Inpatient Hospital Stay (HOSPITAL_COMMUNITY): Payer: Medicaid Other | Admitting: Anesthesiology

## 2021-07-18 ENCOUNTER — Inpatient Hospital Stay (HOSPITAL_COMMUNITY)
Admission: AD | Admit: 2021-07-18 | Discharge: 2021-07-20 | DRG: 806 | Disposition: A | Discharge status: Home / Self Care | Payer: Medicaid Other | Attending: Obstetrics and Gynecology | Admitting: Obstetrics and Gynecology

## 2021-07-18 ENCOUNTER — Encounter (HOSPITAL_COMMUNITY): Payer: Self-pay | Admitting: Obstetrics and Gynecology

## 2021-07-18 DIAGNOSIS — Z23 Encounter for immunization: Secondary | ICD-10-CM

## 2021-07-18 DIAGNOSIS — Z20822 Contact with and (suspected) exposure to covid-19: Secondary | ICD-10-CM | POA: Diagnosis present

## 2021-07-18 DIAGNOSIS — O134 Gestational [pregnancy-induced] hypertension without significant proteinuria, complicating childbirth: Secondary | ICD-10-CM | POA: Diagnosis present

## 2021-07-18 DIAGNOSIS — Z91040 Latex allergy status: Secondary | ICD-10-CM

## 2021-07-18 DIAGNOSIS — Z0379 Encounter for other suspected maternal and fetal conditions ruled out: Secondary | ICD-10-CM

## 2021-07-18 DIAGNOSIS — O1414 Severe pre-eclampsia complicating childbirth: Secondary | ICD-10-CM | POA: Diagnosis not present

## 2021-07-18 DIAGNOSIS — Z3483 Encounter for supervision of other normal pregnancy, third trimester: Secondary | ICD-10-CM | POA: Diagnosis not present

## 2021-07-18 DIAGNOSIS — Z7982 Long term (current) use of aspirin: Secondary | ICD-10-CM

## 2021-07-18 DIAGNOSIS — D62 Acute posthemorrhagic anemia: Secondary | ICD-10-CM | POA: Diagnosis not present

## 2021-07-18 DIAGNOSIS — O26893 Other specified pregnancy related conditions, third trimester: Secondary | ICD-10-CM | POA: Diagnosis present

## 2021-07-18 DIAGNOSIS — Z3A39 39 weeks gestation of pregnancy: Secondary | ICD-10-CM

## 2021-07-18 DIAGNOSIS — O1490 Unspecified pre-eclampsia, unspecified trimester: Secondary | ICD-10-CM | POA: Diagnosis not present

## 2021-07-18 DIAGNOSIS — O99824 Streptococcus B carrier state complicating childbirth: Secondary | ICD-10-CM | POA: Diagnosis present

## 2021-07-18 DIAGNOSIS — O9081 Anemia of the puerperium: Secondary | ICD-10-CM | POA: Diagnosis not present

## 2021-07-18 LAB — PROTEIN / CREATININE RATIO, URINE
Creatinine, Urine: 160.91 mg/dL
Protein Creatinine Ratio: 0.3 mg/mg{Cre} — ABNORMAL HIGH (ref 0.00–0.15)
Total Protein, Urine: 48 mg/dL

## 2021-07-18 LAB — CBC
HCT: 34.5 % — ABNORMAL LOW (ref 36.0–46.0)
HCT: 30.9 % — ABNORMAL LOW (ref 36.0–46.0)
Hemoglobin: 12.5 g/dL (ref 12.0–15.0)
Hemoglobin: 11 g/dL — ABNORMAL LOW (ref 12.0–15.0)
MCH: 28 pg (ref 26.0–34.0)
MCH: 27.7 pg (ref 26.0–34.0)
MCHC: 36.2 g/dL — ABNORMAL HIGH (ref 30.0–36.0)
MCHC: 35.6 g/dL (ref 30.0–36.0)
MCV: 77.4 fL — ABNORMAL LOW (ref 80.0–100.0)
MCV: 77.8 fL — ABNORMAL LOW (ref 80.0–100.0)
Platelets: 259 10*3/uL (ref 150–400)
Platelets: 287 10*3/uL (ref 150–400)
RBC: 4.46 MIL/uL (ref 3.87–5.11)
RBC: 3.97 MIL/uL (ref 3.87–5.11)
RDW: 13.4 % (ref 11.5–15.5)
RDW: 13.6 % (ref 11.5–15.5)
WBC: 13.5 10*3/uL — ABNORMAL HIGH (ref 4.0–10.5)
WBC: 13.3 10*3/uL — ABNORMAL HIGH (ref 4.0–10.5)
nRBC: 0 % (ref 0.0–0.2)
nRBC: 0 % (ref 0.0–0.2)

## 2021-07-18 LAB — RESP PANEL BY RT-PCR (FLU A&B, COVID) ARPGX2
Influenza A by PCR: NEGATIVE
Influenza B by PCR: NEGATIVE
SARS Coronavirus 2 by RT PCR: NEGATIVE

## 2021-07-18 LAB — COMPREHENSIVE METABOLIC PANEL
ALT: 26 U/L (ref 0–44)
AST: 24 U/L (ref 15–41)
Albumin: 3 g/dL — ABNORMAL LOW (ref 3.5–5.0)
Alkaline Phosphatase: 250 U/L — ABNORMAL HIGH (ref 38–126)
Anion gap: 10 (ref 5–15)
BUN: 5 mg/dL — ABNORMAL LOW (ref 6–20)
CO2: 21 mmol/L — ABNORMAL LOW (ref 22–32)
Calcium: 8.9 mg/dL (ref 8.9–10.3)
Chloride: 103 mmol/L (ref 98–111)
Creatinine, Ser: 0.6 mg/dL (ref 0.44–1.00)
GFR, Estimated: >60 mL/min (ref >60)
Glucose, Bld: 92 mg/dL (ref 70–99)
Potassium: 4.2 mmol/L (ref 3.5–5.1)
Sodium: 134 mmol/L — ABNORMAL LOW (ref 135–145)
Total Bilirubin: 0.5 mg/dL (ref 0.3–1.2)
Total Protein: 7.1 g/dL (ref 6.5–8.1)

## 2021-07-18 LAB — TYPE AND SCREEN
ABO/RH(D): O POS
Antibody Screen: NEGATIVE

## 2021-07-18 MED ORDER — TRANEXAMIC ACID-NACL 1000-0.7 MG/100ML-% IV SOLN
1000.0000 mg | INTRAVENOUS | Status: AC
  Administered 2021-07-18: 1000 mg via INTRAVENOUS
  Filled 2021-07-18: qty 100

## 2021-07-18 MED ORDER — DIPHENHYDRAMINE HCL 50 MG/ML IJ SOLN: 12.5000 mg | INTRAMUSCULAR | Status: DC | PRN

## 2021-07-18 MED ORDER — BENZOCAINE-MENTHOL 20-0.5 % EX AERO: 1.0000 "application " | INHALATION_SPRAY | CUTANEOUS | Status: DC | PRN

## 2021-07-18 MED ORDER — MAGNESIUM SULFATE 40 GM/1000ML IV SOLN
2.0000 g/h | INTRAVENOUS | Status: AC
  Administered 2021-07-18 – 2021-07-19 (×2): 2 g/h via INTRAVENOUS
  Filled 2021-07-18 (×2): qty 1000

## 2021-07-18 MED ORDER — LABETALOL HCL 5 MG/ML IV SOLN: 40.0000 mg | INTRAVENOUS | Status: DC | PRN

## 2021-07-18 MED ORDER — PRENATAL MULTIVITAMIN CH
1.0000 | ORAL_TABLET | Freq: Every day | ORAL | Status: DC
  Administered 2021-07-19 – 2021-07-20 (×2): 1 via ORAL
  Filled 2021-07-18 (×2): qty 1

## 2021-07-18 MED ORDER — HYDRALAZINE HCL 20 MG/ML IJ SOLN: 10.0000 mg | INTRAMUSCULAR | Status: DC | PRN

## 2021-07-18 MED ORDER — FENTANYL-BUPIVACAINE-NACL 0.5-0.125-0.9 MG/250ML-% EP SOLN
12.0000 mL/h | EPIDURAL | Status: DC | PRN
  Administered 2021-07-18: 12 mL/h via EPIDURAL
  Filled 2021-07-18: qty 250

## 2021-07-18 MED ORDER — PHENYLEPHRINE 40 MCG/ML (10ML) SYRINGE FOR IV PUSH (FOR BLOOD PRESSURE SUPPORT): 80.0000 ug | PREFILLED_SYRINGE | INTRAVENOUS | Status: DC | PRN

## 2021-07-18 MED ORDER — OXYTOCIN BOLUS FROM INFUSION
333.0000 mL | Freq: Once | INTRAVENOUS | Status: AC
  Administered 2021-07-18: 333 mL via INTRAVENOUS

## 2021-07-18 MED ORDER — TRANEXAMIC ACID-NACL 1000-0.7 MG/100ML-% IV SOLN: 1000.0000 mg | Freq: Once | INTRAVENOUS | Status: DC | PRN

## 2021-07-18 MED ORDER — ACETAMINOPHEN 325 MG PO TABS
650.0000 mg | ORAL_TABLET | ORAL | Status: DC | PRN
  Administered 2021-07-18 – 2021-07-19 (×4): 650 mg via ORAL
  Filled 2021-07-18 (×4): qty 2

## 2021-07-18 MED ORDER — ONDANSETRON HCL 4 MG/2ML IJ SOLN: 4.0000 mg | Freq: Four times a day (QID) | INTRAMUSCULAR | Status: DC | PRN

## 2021-07-18 MED ORDER — LABETALOL HCL 5 MG/ML IV SOLN
20.0000 mg | INTRAVENOUS | Status: DC | PRN
  Administered 2021-07-18: 20 mg via INTRAVENOUS
  Filled 2021-07-18: qty 4

## 2021-07-18 MED ORDER — WITCH HAZEL-GLYCERIN EX PADS: 1.0000 "application " | MEDICATED_PAD | CUTANEOUS | Status: DC | PRN

## 2021-07-18 MED ORDER — HYDRALAZINE HCL 20 MG/ML IJ SOLN: 5.0000 mg | INTRAMUSCULAR | Status: DC | PRN

## 2021-07-18 MED ORDER — SODIUM CHLORIDE 0.9 % IV SOLN
2.0000 g | Freq: Once | INTRAVENOUS | Status: AC
  Administered 2021-07-18: 2 g via INTRAVENOUS
  Filled 2021-07-18: qty 2000

## 2021-07-18 MED ORDER — DIPHENHYDRAMINE HCL 25 MG PO CAPS: 25.0000 mg | ORAL_CAPSULE | Freq: Four times a day (QID) | ORAL | Status: DC | PRN

## 2021-07-18 MED ORDER — LIDOCAINE-EPINEPHRINE (PF) 1.5 %-1:200000 IJ SOLN
INTRAMUSCULAR | Status: DC | PRN
  Administered 2021-07-18: 5 mL via EPIDURAL

## 2021-07-18 MED ORDER — TETANUS-DIPHTH-ACELL PERTUSSIS 5-2.5-18.5 LF-MCG/0.5 IM SUSY
0.5000 mL | PREFILLED_SYRINGE | Freq: Once | INTRAMUSCULAR | Status: AC
  Administered 2021-07-19: 0.5 mL via INTRAMUSCULAR
  Filled 2021-07-18: qty 0.5

## 2021-07-18 MED ORDER — LACTATED RINGERS IV SOLN: 500.0000 mL | INTRAVENOUS | Status: DC | PRN

## 2021-07-18 MED ORDER — LACTATED RINGERS IV SOLN: INTRAVENOUS | Status: DC

## 2021-07-18 MED ORDER — SENNOSIDES-DOCUSATE SODIUM 8.6-50 MG PO TABS
2.0000 | ORAL_TABLET | Freq: Every day | ORAL | Status: DC
  Administered 2021-07-19 – 2021-07-20 (×2): 2 via ORAL
  Filled 2021-07-18 (×2): qty 2

## 2021-07-18 MED ORDER — ACETAMINOPHEN 325 MG PO TABS: 650.0000 mg | ORAL_TABLET | ORAL | Status: DC | PRN

## 2021-07-18 MED ORDER — OXYCODONE-ACETAMINOPHEN 5-325 MG PO TABS: 1.0000 | ORAL_TABLET | ORAL | Status: DC | PRN

## 2021-07-18 MED ORDER — COCONUT OIL OIL
1.0000 "application " | TOPICAL_OIL | Status: DC | PRN
  Administered 2021-07-19: 1 via TOPICAL

## 2021-07-18 MED ORDER — DIBUCAINE (PERIANAL) 1 % EX OINT: 1.0000 "application " | TOPICAL_OINTMENT | CUTANEOUS | Status: DC | PRN

## 2021-07-18 MED ORDER — EPHEDRINE 5 MG/ML INJ: 10.0000 mg | INTRAVENOUS | Status: DC | PRN

## 2021-07-18 MED ORDER — OXYTOCIN-SODIUM CHLORIDE 30-0.9 UT/500ML-% IV SOLN
2.5000 [IU]/h | INTRAVENOUS | Status: DC
  Filled 2021-07-18: qty 500

## 2021-07-18 MED ORDER — LACTATED RINGERS IV SOLN: 500.0000 mL | Freq: Once | INTRAVENOUS | Status: DC

## 2021-07-18 MED ORDER — ONDANSETRON HCL 4 MG/2ML IJ SOLN: 4.0000 mg | INTRAMUSCULAR | Status: DC | PRN

## 2021-07-18 MED ORDER — OXYCODONE-ACETAMINOPHEN 5-325 MG PO TABS: 2.0000 | ORAL_TABLET | ORAL | Status: DC | PRN

## 2021-07-18 MED ORDER — MISOPROSTOL 200 MCG PO TABS: 1000.0000 ug | ORAL_TABLET | Freq: Once | ORAL | Status: AC

## 2021-07-18 MED ORDER — FENTANYL CITRATE (PF) 100 MCG/2ML IJ SOLN: 50.0000 ug | INTRAMUSCULAR | Status: DC | PRN

## 2021-07-18 MED ORDER — MISOPROSTOL 200 MCG PO TABS
ORAL_TABLET | ORAL | Status: AC
  Administered 2021-07-18: 1000 ug via RECTAL
  Filled 2021-07-18: qty 5

## 2021-07-18 MED ORDER — SODIUM CHLORIDE 0.9 % IV SOLN
1.0000 g | INTRAVENOUS | Status: DC
  Administered 2021-07-18: 1 g via INTRAVENOUS
  Filled 2021-07-18: qty 1000

## 2021-07-18 MED ORDER — ONDANSETRON HCL 4 MG PO TABS: 4.0000 mg | ORAL_TABLET | ORAL | Status: DC | PRN

## 2021-07-18 MED ORDER — LIDOCAINE HCL (PF) 1 % IJ SOLN: 30.0000 mL | INTRAMUSCULAR | Status: DC | PRN

## 2021-07-18 MED ORDER — IBUPROFEN 600 MG PO TABS
600.0000 mg | ORAL_TABLET | Freq: Four times a day (QID) | ORAL | Status: DC
  Administered 2021-07-18 – 2021-07-20 (×8): 600 mg via ORAL
  Filled 2021-07-18 (×8): qty 1

## 2021-07-18 MED ORDER — LIDOCAINE HCL (PF) 1 % IJ SOLN
INTRAMUSCULAR | Status: DC | PRN
  Administered 2021-07-18: 3 mL via EPIDURAL

## 2021-07-18 MED ORDER — SOD CITRATE-CITRIC ACID 500-334 MG/5ML PO SOLN: 30.0000 mL | ORAL | Status: DC | PRN

## 2021-07-18 MED ORDER — LACTATED RINGERS IV SOLN
INTRAVENOUS | Status: DC
Start: 2021-07-18 — End: 2021-07-20

## 2021-07-18 MED ORDER — ZOLPIDEM TARTRATE 5 MG PO TABS: 5.0000 mg | ORAL_TABLET | Freq: Every evening | ORAL | Status: DC | PRN

## 2021-07-18 MED ORDER — SIMETHICONE 80 MG PO CHEW
80.0000 mg | CHEWABLE_TABLET | ORAL | Status: DC | PRN
  Filled 2021-07-18: qty 1

## 2021-07-18 MED ORDER — MAGNESIUM SULFATE BOLUS VIA INFUSION
4.0000 g | Freq: Once | INTRAVENOUS | Status: AC
  Administered 2021-07-18: 4 g via INTRAVENOUS
  Filled 2021-07-18: qty 1000

## 2021-07-18 NOTE — H&P (Cosign Needed)
[name] is a 26 y.o. female, G3P2002, IUP at 39.2 weeks, presenting for active labor. GBS+, intact. Baby female desires in pt circ. Hgb C trait. Severe range BP x1 in MAU not treated, repeat elevated but not severe range 164/98, 157/92. H/O GHTN and PPH. Latex allergy. Denies HA, RUQ  pain or vision changes. Pt endorse + Fm. Denies vaginal leakage. Denies vaginal bleeding.   Patient Active Problem List   Diagnosis Date Noted   Normal labor and delivery 07/18/2021   SVD (spontaneous vaginal delivery) 01/15/2020   PPH (postpartum hemorrhage) 01/15/2020   Anemia due to acute blood loss 01/15/2020   Gestational hypertension 01/15/2020   Polyhydramnios 01/14/2020   Postpartum care following vaginal delivery 9/6 01/14/2020     Active Ambulatory Problems    Diagnosis Date Noted   Polyhydramnios 01/14/2020   SVD (spontaneous vaginal delivery) 01/15/2020   PPH (postpartum hemorrhage) 01/15/2020   Anemia due to acute blood loss 01/15/2020   Gestational hypertension 01/15/2020   Postpartum care following vaginal delivery 9/6 01/14/2020   Resolved Ambulatory Problems    Diagnosis Date Noted   Normal labor 09/14/2018   Gestational hypertension 09/14/2018   Shoulder dystocia during labor and delivery 09/14/2018   Delivery of first pregnancy by vacuum extraction 09/14/2018   Postpartum hemorrhage 09/14/2018   Postpartum anemia 09/15/2018   Past Medical History:  Diagnosis Date   History of postpartum hemorrhage    PCOS (polycystic ovarian syndrome)       Medications Prior to Admission  Medication Sig Dispense Refill Last Dose   aspirin EC 81 MG tablet Take 81 mg by mouth daily. Swallow whole.      Cholecalciferol (VITAMIN D) 50 MCG (2000 UT) CAPS Take by mouth.      prenatal vitamin w/FE, FA (PRENATAL 1 + 1) 27-1 MG TABS tablet Take 1 tablet by mouth daily at 12 noon.       Past Medical History:  Diagnosis Date   History of postpartum hemorrhage    PCOS (polycystic ovarian  syndrome)      No current facility-administered medications on file prior to encounter.   Current Outpatient Medications on File Prior to Encounter  Medication Sig Dispense Refill   aspirin EC 81 MG tablet Take 81 mg by mouth daily. Swallow whole.     Cholecalciferol (VITAMIN D) 50 MCG (2000 UT) CAPS Take by mouth.     prenatal vitamin w/FE, FA (PRENATAL 1 + 1) 27-1 MG TABS tablet Take 1 tablet by mouth daily at 12 noon.       Allergies  Allergen Reactions   Latex Rash    History of present pregnancy: Pt Info/Preference:  Screening/Consents:  Labs:   EDD: Estimated Date of Delivery: 07/23/21  Establised: Patient's last menstrual period was 10/10/2020.  Anatomy Scan: Date: 04/01/2021 Placenta Location: posterior, complete Genetic Screen: Panoroma:LR female AFP: neg First Tri: Quad:  Office: [facility]             First PNV: 9.1 weeks Blood Type  O+  Language: englsh Last PNV: 39.1 weeks Rhogam  N/A  Flu Vaccine:  declined   Antibody  Neg  TDaP vaccine UTD   GTT: Early: 5.2 Third Trimester: 85  Feeding Plan: breast BTL: no Rubella:  immune  Contraception: ??? VBAC: no RPR:   nr  Circumcision: In pt desired   HBsAg:  neg  Pediatrician:  ???   HIV:   neg  Prenatal Classes: no Additional [place]: no GBS:  Positive (For PCN  allergy, check sensitivities)       Chlamydia: neg    MFM Referral/Consult:  GC: neg  Support Person: partner   PAP: ???  Pain Management: epidural Neonatologist Referral:  Hgb Electrophoresis:  AC  Birth Plan: DCC   Hgb NOB: 13.6    28W: 12.5   OB History     Gravida  3   Para  2   Term  2   Preterm      AB      Living  2      SAB      IAB      Ectopic      Multiple  0   Live Births  2          Past Medical History:  Diagnosis Date   History of postpartum hemorrhage    PCOS (polycystic ovarian syndrome)    Past Surgical History:  Procedure Laterality Date   MANDIBLE FRACTURE SURGERY     NECK SURGERY     TONSILLECTOMY     Family  History: family history is not on file. Social History:  reports that she has never smoked. She has never used smokeless tobacco. She reports that she does not drink alcohol and does not use drugs.   Prenatal Transfer Tool  Maternal Diabetes: No Genetic Screening: Normal Maternal Ultrasounds/Referrals: Normal Fetal Ultrasounds or other Referrals:  None Maternal Substance Abuse:  No Significant Maternal Medications:  None Significant Maternal Lab Results: Group B Strep positive  ROS:  Review of Systems  Constitutional: Negative.   HENT: Negative.    Eyes: Negative.   Respiratory: Negative.    Cardiovascular: Negative.   Gastrointestinal:  Positive for abdominal pain.  Genitourinary: Negative.   Musculoskeletal: Negative.   Skin: Negative.   Neurological: Negative.   Endo/Heme/Allergies: Negative.   Psychiatric/Behavioral: Negative.      Physical Exam: LMP 10/10/2020   Physical Exam Vitals and nursing note reviewed.  Constitutional:      Appearance: Normal appearance.  HENT:     Head: Normocephalic and atraumatic.     Nose: Nose normal.     Mouth/Throat:     Mouth: Mucous membranes are moist.  Eyes:     Pupils: Pupils are equal, round, and reactive to light.  Cardiovascular:     Rate and Rhythm: Normal rate and regular rhythm.     Pulses: Normal pulses.     Heart sounds: Normal heart sounds.  Pulmonary:     Effort: Pulmonary effort is normal.     Breath sounds: Normal breath sounds.  Abdominal:     General: Abdomen is flat.  Genitourinary:    Comments: Pelvis adequate for vaginal delivery, uterus gravida, pelvis proven tto 8.2lbs.  Musculoskeletal:        General: Normal range of motion.     Cervical back: Normal range of motion and neck supple.  Skin:    General: Skin is warm.     Capillary Refill: Capillary refill takes less than 2 seconds.  Neurological:     General: No focal deficit present.     Mental Status: She is alert.  Psychiatric:        Mood and  Affect: Mood normal.     NST: FHR baseline 145 bpm, Variability: moderate, Accelerations:present, Decelerations:  Absent= Cat 1/Reactive UC:   regular, every 2-4 minutes SVE:   Dilation: 7.5 Effacement (%): 90 Station: -1 Exam by:: K.Wilson,RN, vertex verified by fetal sutures.  Leopold's: Position vertex, EFW 8lbs via leopold's.  Labs: No results found for this or any previous visit (from the past 24 hour(s)).  Imaging:  No results found.  MAU Course: Orders Placed This Encounter  Procedures   CBC   RPR   Diet clear liquid Room service appropriate? Yes; Fluid consistency: Thin   Vitals signs per unit policy   Notify physician (specify)   Fetal monitoring per unit policy   Activity as tolerated   Cervical Exam   Measure blood pressure post delivery every 15 min x 1 hour then every 30 min x 1 hour   Fundal check post delivery every 15 min x 1 hour then every 30 min x 1 hour   If Rapid HIV test positive or known HIV positive: initiate AZT orders   May in and out cath x 2 for inability to void   Insert urethral catheter X 1 PRN If Coude Catheter is chosen, qualified resources by campus can be found in the clinical skills nursing procedure for Coude Catheter 1. If straight catheterized > 2 times or patient unable to void post epidural plac...   Refer to Sidebar Report Urinary (Foley) Catheter Indications   Refer to Sidebar Report Post Indwelling Urinary Catheter Removal and Intervention Guidelines   Discontinue foley prior to vaginal delivery   Initiate Carrier Fluid Protocol   Initiate Oral Care Protocol   Patient may have epidural placement upon request   Full code   Nitrous Oxide 50%/Oxygen 50%   Type and screen [facility]   Insert and maintain IV Line   Admit to Inpatient (patient's expected length of stay will be greater than 2 midnights or inpatient only procedure)   Meds ordered this encounter  Medications   lactated ringers infusion   oxytocin  (PITOCIN) IV BOLUS FROM BAG   oxytocin (PITOCIN) IV infusion 30 units in NS 500 mL - Premix   lactated ringers infusion 500-1,000 mL   acetaminophen (TYLENOL) tablet 650 mg   oxyCODONE-acetaminophen (PERCOCET/ROXICET) 5-325 MG per tablet 1 tablet   oxyCODONE-acetaminophen (PERCOCET/ROXICET) 5-325 MG per tablet 2 tablet   ondansetron (ZOFRAN) injection 4 mg   sodium citrate-citric acid (ORACIT) solution 30 mL   lidocaine (PF) (XYLOCAINE) 1 % injection 30 mL   FOLLOWED BY Linked Order Group    ampicillin (OMNIPEN) 2 g in sodium chloride 0.9 % 100 mL IVPB     Order Specific Question:   Antibiotic Indication:     Answer:   Group B Strep Prophylaxis    ampicillin (OMNIPEN) 1 g in sodium chloride 0.9 % 100 mL IVPB     Order Specific Question:   Antibiotic Indication:     Answer:   Group B Strep Prophylaxis   fentaNYL (SUBLIMAZE) injection 50-100 mcg    Assessment/Plan: [name] is a 26 y.o. female, G3P2002, IUP at 39.2 weeks, presenting for active labor. GBS+, intact. Baby female desires in pt circ. Hgb C trait. Severe range BP x1 in MAU not treated, repeat elevated but not severe range 164/98, 157/92. H/O GHTN and PPH. Latex allergy. Denies HA, RUQ  pain or vision changes. Pt endorse + Fm. Denies vaginal leakage. Denies vaginal bleeding.  FWB: Cat 1 Fetal Tracing.   Plan: Admit to Birthing Suite per consult with Dr Su Hiltoberts Routine [facility] orders Pain med/epidural prn Amp for GBS prophylaxis for advanced dilation Latex allergy: Avoid latex Elevated BP: Pending CMP, PCR urine, CBC, monitor BP.  Anticipate labor progression   Dale DurhamJade Sangeeta Youse NP-C, CNM, MSN 07/18/2021, 1:21 PM

## 2021-07-18 NOTE — MAU Note (Signed)
.  [name] is a 26 y.o. at [redacted]w[redacted]d here in MAU reporting: ctx since earlier this morning. Stronger and closer now. Denies any leaking but reports some bloody show. Good fetal movement felt. ?Pain score: 9/10There were no vitals filed for this visit.   ?FHT:145 ?Lab orders placed from triage:   ? ?

## 2021-07-18 NOTE — Anesthesia Preprocedure Evaluation (Signed)
Anesthesia Evaluation  Patient identified by MRN, date of birth, ID band Patient awake    Reviewed: Allergy & Precautions, Patient's Chart, lab work & pertinent test results  Airway Mallampati: II  TM Distance: >3 FB Neck ROM: Full    Dental no notable dental hx.    Pulmonary neg pulmonary ROS,    Pulmonary exam normal breath sounds clear to auscultation       Cardiovascular negative cardio ROS Normal cardiovascular exam Rhythm:Regular Rate:Normal     Neuro/Psych negative neurological ROS     GI/Hepatic negative GI ROS, Neg liver ROS,   Endo/Other  negative endocrine ROS  Renal/GU negative Renal ROS     Musculoskeletal negative musculoskeletal ROS (+)   Abdominal   Peds  Hematology negative hematology ROS (+)   Anesthesia Other Findings   Reproductive/Obstetrics (+) Pregnancy                             Anesthesia Physical Anesthesia Plan  ASA: 2  Anesthesia Plan: Epidural   Post-op Pain Management:    Induction:   PONV Risk Score and Plan:   Airway Management Planned:   Additional Equipment:   Intra-op Plan:   Post-operative Plan:   Informed Consent: I have reviewed the patients History and Physical, chart, labs and discussed the procedure including the risks, benefits and alternatives for the proposed anesthesia with the patient or authorized representative who has indicated his/her understanding and acceptance.       Plan Discussed with:   Anesthesia Plan Comments:         Anesthesia Quick Evaluation  

## 2021-07-18 NOTE — MAU Provider Note (Signed)
[name] is a 26 y.o. G51P2002 female at [redacted]w[redacted]d weeks gestation presenting to MAU with complaints of painful contractions since yesterday, but stronger this morning. MAU provider was requested by RN to verify presentation. ? ?NST - FHR: 130 bpm / moderate variability / accels present / decels absent / TOCO: regular every 2-3 mins  ? ?Patient informed that the ultrasound is considered a limited OB ultrasound and is not intended to be a complete ultrasound exam.  Patient also informed that the ultrasound is not being completed with the intent of assessing for fetal or placental anomalies or any pelvic abnormalities.  Explained that the purpose of today?s ultrasound is to assess for presentation.  Baby was found to be in a cephalic presentation. Patient acknowledges the purpose of the exam and the limitations of the study. ? ?Noralyn Pick, CNM notified by Heywood Bene, RN @ [phone] ? ?Laury Deep, CNM  ?07/18/2021 1:23 PM  ? ?

## 2021-07-18 NOTE — Anesthesia Procedure Notes (Addendum)
Epidural ?Patient location during procedure: OB ?Start time: 07/18/2021 2:33 PM ?End time: 07/18/2021 2:56 PM ? ?Staffing ?Anesthesiologist: Nolon Nations, MD ?Performed: anesthesiologist  ? ?Preanesthetic Checklist ?Completed: patient identified, IV checked, risks and benefits discussed, monitors and equipment checked, pre-op evaluation and timeout performed ? ?Epidural ?Patient position: sitting ?Prep: DuraPrep and site prepped and draped ?Patient monitoring: heart rate, continuous pulse ox and blood pressure ?Approach: midline ?Location: L3-L4 ?Injection technique: LOR air and LOR saline ? ?Needle:  ?Needle type: Tuohy  ?Needle gauge: 17 G ?Needle length: 9 cm ?Needle insertion depth: 9 cm ?Catheter type: closed end flexible ?Catheter size: 19 Gauge ?Catheter at skin depth: 15 cm ?Test dose: negative ? ?Assessment ?Sensory level: T8 ?Events: blood not aspirated, injection not painful, no injection resistance, no paresthesia and negative IV test ? ?Additional Notes ?Reason for block:procedure for pain ? ? ? ?

## 2021-07-19 LAB — CBC
HCT: 27.1 % — ABNORMAL LOW (ref 36.0–46.0)
Hemoglobin: 10.1 g/dL — ABNORMAL LOW (ref 12.0–15.0)
MCH: 28.2 pg (ref 26.0–34.0)
MCHC: 37.3 g/dL — ABNORMAL HIGH (ref 30.0–36.0)
MCV: 75.7 fL — ABNORMAL LOW (ref 80.0–100.0)
Platelets: 237 10*3/uL (ref 150–400)
RBC: 3.58 MIL/uL — ABNORMAL LOW (ref 3.87–5.11)
RDW: 13.5 % (ref 11.5–15.5)
WBC: 13.8 10*3/uL — ABNORMAL HIGH (ref 4.0–10.5)
nRBC: 0 % (ref 0.0–0.2)

## 2021-07-19 LAB — COMPREHENSIVE METABOLIC PANEL
ALT: 24 U/L (ref 0–44)
AST: 32 U/L (ref 15–41)
Albumin: 2.4 g/dL — ABNORMAL LOW (ref 3.5–5.0)
Alkaline Phosphatase: 187 U/L — ABNORMAL HIGH (ref 38–126)
Anion gap: 9 (ref 5–15)
BUN: 6 mg/dL (ref 6–20)
CO2: 22 mmol/L (ref 22–32)
Calcium: 7.6 mg/dL — ABNORMAL LOW (ref 8.9–10.3)
Chloride: 102 mmol/L (ref 98–111)
Creatinine, Ser: 0.76 mg/dL (ref 0.44–1.00)
GFR, Estimated: >60 mL/min (ref >60)
Glucose, Bld: 101 mg/dL — ABNORMAL HIGH (ref 70–99)
Potassium: 4 mmol/L (ref 3.5–5.1)
Sodium: 133 mmol/L — ABNORMAL LOW (ref 135–145)
Total Bilirubin: 0.8 mg/dL (ref 0.3–1.2)
Total Protein: 5.6 g/dL — ABNORMAL LOW (ref 6.5–8.1)

## 2021-07-19 LAB — RPR: RPR Ser Ql: NONREACTIVE

## 2021-07-19 MED ORDER — CYCLOBENZAPRINE HCL 10 MG PO TABS
10.0000 mg | ORAL_TABLET | Freq: Three times a day (TID) | ORAL | Status: DC | PRN
  Administered 2021-07-19: 10 mg via ORAL
  Filled 2021-07-19: qty 1

## 2021-07-19 MED ORDER — NIFEDIPINE ER OSMOTIC RELEASE 30 MG PO TB24
30.0000 mg | ORAL_TABLET | Freq: Every day | ORAL | Status: DC
  Administered 2021-07-19 – 2021-07-20 (×2): 30 mg via ORAL
  Filled 2021-07-19 (×2): qty 1

## 2021-07-19 NOTE — Lactation Note (Addendum)
This note was copied from a baby's chart. ?Lactation Consultation Note ? ?Patient Name: Boy [place] Matranga ?Today's Date: 07/19/2021 ?Reason for consult: Initial assessment;Term ?Age:28 hours ? ? Mom had no questions or concerns for LC. She says infant is latching well with voids and stools. Mom is a P3 & experienced with breastfeeding. Formula was noted to be in bassinet, but, per chart, infant had not yet received any formula.  ? ?I noted that Mom had a DEBP set up in room. I inquired as to why, Mom said she had requested it b/c she had used one with her first 2 children, but she had not had time to use the DEBP with this infant b/c of time spent feeding, etc. I let Mom know that, at this time, I did not see reason for her to pump.  ? ?Mom was made aware of O/P services, breastfeeding support groups, community resources ([employer]) and our phone # for post-discharge questions.  ? ?Mom does not desire f/u with lactation during her in-patient stay (unless a concern arises).  ? ? ?Lurline Hare [place] ?07/19/2021, 1:32 PM ? ? ? ?

## 2021-07-19 NOTE — Progress Notes (Signed)
Subjective: ?Postpartum Day # 1 : S/P NSVD due to pt was admitted on 3/10 for active labor at 7 cm, progressed naturally with epidural, SROM mod mec right before delivery, SVD on 3/10 over intact pernium with EBL of , received TXA pit and cytotoec for h/o PPH and uterus was boggy post delivery, hgb drop from 11-10.1, baby female desires in pt circ, pt developed preE with SF with right after delivery Severe range BP and was place don 24 hours mag, off on 3/11 @ 2000, pt was started on procardia 30mg  XL daily r/t elevated BP during the day 135/99, asymptomatic, PCR 0.30, cmp unremarkable. Patient up ad lib, denies syncope or dizziness. Reports consuming regular diet without issues and denies N/V. Patient reports 0 bowel movement + passing flatus.  Denies issues with urination and reports bleeding is "lighter."  Patient is breastfeeding and reports going well.  Desires 5 weeks PP tubal for postpartum contraception.  Pain is being appropriately managed with use of po meds. Pt reports back spasm, and having chronic issues with back spasms.  ? ?no laceration ?Feeding:  breast ?Contraceptive plan:  5 weeks PP tubal ?BB: Circ in pt desired ? ?Objective: ?Vital signs in last 24 hours: ?Patient Vitals for the past 24 hrs: ? BP Temp Temp src Pulse Resp SpO2  ?07/19/21 1206 (!) 135/99 -- -- 80 -- --  ?07/19/21 1200 -- 97.6 ?F (36.4 ?C) Oral -- 18 100 %  ?07/19/21 1100 -- -- -- -- 17 --  ?07/19/21 1000 -- -- -- -- 18 --  ?07/19/21 0900 -- -- -- -- 17 --  ?07/19/21 0805 (!) 143/91 98.1 ?F (36.7 ?C) Oral 83 17 100 %  ?07/19/21 0800 -- -- -- -- 17 --  ?07/19/21 0700 -- -- -- -- 17 --  ?07/19/21 0622 -- -- -- -- 17 --  ?07/19/21 0558 -- -- -- -- 18 --  ?07/19/21 0546 125/87 98.1 ?F (36.7 ?C) Oral 81 18 100 %  ?07/19/21 0430 -- -- -- -- 16 --  ?07/19/21 0317 138/80 -- -- 81 18 --  ?07/19/21 0200 140/78 -- -- 85 17 --  ?07/19/21 0101 (!) 141/82 -- -- 82 18 --  ?07/19/21 0001 (!) 146/97 -- -- 88 18 --  ?07/18/21 2328 (!) 153/89 --  -- 91 17 --  ?07/18/21 2201 (!) 146/99 -- -- 89 16 --  ?07/18/21 2100 (!) 152/93 98.3 ?F (36.8 ?C) Oral 94 18 --  ?07/18/21 2032 (!) 146/90 98.3 ?F (36.8 ?C) Oral 89 18 100 %  ?07/18/21 1945 (!) 154/92 -- -- 84 17 --  ?07/18/21 1935 (!) 153/96 98.9 ?F (37.2 ?C) Oral 87 17 --  ?07/18/21 1902 (!) 161/99 -- -- 90 18 --  ?07/18/21 1900 (!) 161/101 -- -- 95 -- --  ?07/18/21 1845 (!) 150/98 -- -- 95 -- --  ?07/18/21 1825 (!) 143/92 -- -- 91 18 --  ?07/18/21 1815 -- -- -- 98 -- --  ?07/18/21 1800 (!) 146/88 -- -- 89 20 --  ?07/18/21 1750 (!) 135/91 -- -- (!) 102 18 --  ?07/18/21 1745 (!) 146/89 99.2 ?F (37.3 ?C) Axillary (!) 116 20 --  ?07/18/21 1730 139/86 99.6 ?F (37.6 ?C) Axillary 92 18 --  ?07/18/21 1700 134/85 -- -- 90 20 --  ?07/18/21 1630 139/82 -- -- (!) 105 18 --  ?07/18/21 1600 135/79 -- -- (!) 102 20 --  ?07/18/21 1530 138/79 -- -- (!) 102 18 --  ?  ? ?Physical Exam:  ?General: alert,  cooperative, appears stated age, and no distress ?Mood/Affect: happy ?Lungs: clear to auscultation, no wheezes, rales or rhonchi, symmetric air entry.  ?Heart: normal rate, regular rhythm, normal S1, S2, no murmurs, rubs, clicks or gallops. ?Breast: breasts appear normal, no suspicious masses, no skin or nipple changes or axillary nodes. ?Abdomen:  + bowel sounds, soft, non-tender ?GU: perineum intact, healing well. No signs of external hematomas.  ?Uterine Fundus: firm ?Lochia: appropriate ?Skin: Warm, Dry. ?DVT Evaluation: No evidence of DVT seen on physical exam. ?Negative Homan's sign. ? ?CBC Latest Ref Rng & Units 07/19/2021 07/18/2021 07/18/2021  ?WBC 4.0 - 10.5 K/uL 13.8(H) 13.3(H) 13.5(H)  ?Hemoglobin 12.0 - 15.0 g/dL 10.1(L) 11.0(L) 12.5  ?Hematocrit 36.0 - 46.0 % 27.1(L) 30.9(L) 34.5(L)  ?Platelets 150 - 400 K/uL 237 287 259  ? ? ?Results for orders placed or performed during the hospital encounter of 07/18/21 (from the past 24 hour(s))  ?CBC     Status: Abnormal  ? Collection Time: 07/18/21  6:32 PM  ?Result Value Ref Range   ? WBC 13.3 (H) 4.0 - 10.5 K/uL  ? RBC 3.97 3.87 - 5.11 MIL/uL  ? Hemoglobin 11.0 (L) 12.0 - 15.0 g/dL  ? HCT 30.9 (L) 36.0 - 46.0 %  ? MCV 77.8 (L) 80.0 - 100.0 fL  ? MCH 27.7 26.0 - 34.0 pg  ? MCHC 35.6 30.0 - 36.0 g/dL  ? RDW 13.6 11.5 - 15.5 %  ? Platelets 287 150 - 400 K/uL  ? nRBC 0.0 0.0 - 0.2 %  ?CBC     Status: Abnormal  ? Collection Time: 07/19/21  4:14 AM  ?Result Value Ref Range  ? WBC 13.8 (H) 4.0 - 10.5 K/uL  ? RBC 3.58 (L) 3.87 - 5.11 MIL/uL  ? Hemoglobin 10.1 (L) 12.0 - 15.0 g/dL  ? HCT 27.1 (L) 36.0 - 46.0 %  ? MCV 75.7 (L) 80.0 - 100.0 fL  ? MCH 28.2 26.0 - 34.0 pg  ? MCHC 37.3 (H) 30.0 - 36.0 g/dL  ? RDW 13.5 11.5 - 15.5 %  ? Platelets 237 150 - 400 K/uL  ? nRBC 0.0 0.0 - 0.2 %  ?Comprehensive metabolic panel     Status: Abnormal  ? Collection Time: 07/19/21  4:14 AM  ?Result Value Ref Range  ? Sodium 133 (L) 135 - 145 mmol/L  ? Potassium 4.0 3.5 - 5.1 mmol/L  ? Chloride 102 98 - 111 mmol/L  ? CO2 22 22 - 32 mmol/L  ? Glucose, Bld 101 (H) 70 - 99 mg/dL  ? BUN 6 6 - 20 mg/dL  ? Creatinine, Ser 0.76 0.44 - 1.00 mg/dL  ? Calcium 7.6 (L) 8.9 - 10.3 mg/dL  ? Total Protein 5.6 (L) 6.5 - 8.1 g/dL  ? Albumin 2.4 (L) 3.5 - 5.0 g/dL  ? AST 32 15 - 41 U/L  ? ALT 24 0 - 44 U/L  ? Alkaline Phosphatase 187 (H) 38 - 126 U/L  ? Total Bilirubin 0.8 0.3 - 1.2 mg/dL  ? GFR, Estimated >60 >60 mL/min  ? Anion gap 9 5 - 15  ?  ? ?CBG (last 3)  ?No results for input(s): GLUCAP in the last 72 hours.  ? ?I/O last 3 completed shifts: ?In: 3574.3 [P.O.:2580; I.V.:994.3] ?Out: 4209 [Urine:3800; Blood:409]  ? ?Assessment ?Postpartum Day # 1 : S/P NSVD due to pt was admitted on 3/10 for active labor at 7 cm, progressed naturally with epidural, SROM mod mec right before delivery, SVD on 3/10 over  intact pernium with EBL of 175mls, received TXA pit and cytotoec for h/o PPH and uterus was boggy post delivery, hgb drop from 11-10.1, baby female desires in pt circ, pt developed preE with SF with right after delivery Severe range BP  and was place on 24 hours mag, off on 3/11 @ 2000, pt was started on procardia 30mg  XL daily r/t elevated BP during the day 135/99, asymptomatic, PCR 0.30, cmp unremarkable. Back spasms.  Pt stable. -1 involution. breastfeeding. Hemodynamically stable.  ? ?Plan: ?Continue other mgmt as ordered ?PreE: Mag off at 20000, monitor BP, start procardia 30mg  XL. Hydralazine protocol PRN for BP >160/110.  ?Back spasm: flexeril and k pad.  ?VTE prophylactics: Early ambulated as tolerates.  ?Pain control: Motrin/Tylenol PRN ?Education given regarding options for contraception, including  PP tubal to be done 5 week out .  ?Plan for discharge tomorrow, Breastfeeding, Lactation consult, and Circumcision prior to discharge ? ?Dr. Connye Burkittavies to be updated on patient status ? ?[facility] LLCJade Zain Bingman NP-C, CNM ?07/19/2021, 3:22 PM ?  ?

## 2021-07-19 NOTE — Lactation Note (Signed)
This note was copied from a baby's chart. ?Lactation Consultation Note ?RN asked mom if she would like to see Lactation tonight mom stated no she wanted to wait until morning because the baby and her FOB sleeping and she has been latching the baby w/o difficulty. ? ?Patient Name: [name] ?Today's Date: 07/19/2021 ?  ?Age:28 hours ? ?Maternal Data ?Does the patient have breastfeeding experience prior to this delivery?: Yes ? ?Feeding ?  ? ?LATCH Score ?  ? ?  ? ?  ? ?  ? ?  ? ?  ? ? ?Lactation Tools Discussed/Used ?  ? ?Interventions ?  ? ?Discharge ?  ? ?Consult Status ?  ? ? ? ?Charyl Dancer ?07/19/2021, 1:50 AM ? ? ? ?

## 2021-07-19 NOTE — Progress Notes (Signed)
In to consent for circumcision. ? ?Routine circumcisions performed on newborns have been identified as voluntary, elective procedures by [employer] such as the [employer] of Pediatrics.  It is considered an elective procedure with no definitive medical indication and carries risks. ? ?Risks include but are not limited to bleeding, infection, damage to penis with possible need for further surgery, poor cosmesis, and local anesthetic risks. ? ?Circumcision will only be performed if patient is deemed to have normal anatomy by his Pediatrician, meets adequate criteria for a newborn of similar gestational age after birth and is without infection or other medical issue contraindicating an elective procedure. ? ? ?Patient understands and agrees with above consent ?Patient discussed with parents of infant. ? ?Drema Dallas, DO ? ? ?

## 2021-07-19 NOTE — Anesthesia Postprocedure Evaluation (Signed)
Anesthesia Post Note ? ?Patient: [name] ? ?Procedure(s) Performed: AN AD HOC LABOR EPIDURAL ? ?  ? ?Patient location during evaluation: Mother Baby ?Anesthesia Type: Epidural ?Level of consciousness: awake and alert and oriented ?Pain management: satisfactory to patient ?Vital Signs Assessment: post-procedure vital signs reviewed and stable ?Respiratory status: spontaneous breathing and nonlabored ventilation ?Cardiovascular status: stable ?Postop Assessment: no headache, no backache, no signs of nausea or vomiting, adequate PO intake, patient able to bend at knees, no apparent nausea or vomiting and able to ambulate (patient up walking) ?Anesthetic complications: no ? ? ?No notable events documented. ? ?Last Vitals:  ?Vitals:  ? 07/19/21 0800 07/19/21 0805  ?BP:  (!) 143/91  ?Pulse:  83  ?Resp: 17 17  ?Temp:  36.7 ?C  ?SpO2:  100%  ?  ?Last Pain:  ?Vitals:  ? 07/19/21 0805  ?TempSrc: Oral  ?PainSc:   ? ?Pain Goal:   ? ?  ?  ?  ?  ?  ?  ?  ? ?Amazing Cowman ? ? ? ? ?

## 2021-07-20 MED ORDER — NIFEDIPINE ER 30 MG PO TB24: 30.0000 mg | ORAL_TABLET | Freq: Every day | ORAL | 0 refills | Status: DC

## 2021-07-20 NOTE — Discharge Summary (Signed)
? ?  Postpartum Discharge Summary ? ?   ?Patient Name: [name] ?DOB: 04-03-1996 ?MRN: [id number] ? ?Date of admission: 07/18/2021 ?Delivery date:07/18/2021  ?Delivering provider: MONTANA, JADE C  ?Date of discharge: 07/20/2021 ? ?Admitting diagnosis: Normal labor and delivery [O80] ?Intrauterine pregnancy: [redacted]w[redacted]d    ?Secondary diagnosis:  Principal Problem: ?  Normal labor and delivery ?Active Problems: ?  SVD (spontaneous vaginal delivery) ?  Preeclampsia ?  Acute blood loss anemia ?  Normal postpartum course ? ?Additional problems: GHTN    ?Discharge diagnosis: Term Pregnancy Delivered and Preeclampsia (severe)                                              ?Post partum procedures: magnesium sulfate x 24 hours  ? ?Complications: None ? ?Hospital course: Onset of Labor With Vaginal Delivery      ?26y.o. yo G3P3003 at 350w2das admitted in Active Labor on 07/18/2021. Patient had an uncomplicated labor course as follows:  ?Membrane Rupture Time/Date: 5:41 PM ,07/18/2021   ?Delivery Method:Vaginal, Spontaneous  ?Episiotomy: None  ?Lacerations:    ?Patient had an uncomplicated postpartum course.  She is ambulating, tolerating a regular diet, passing flatus, and urinating well. Patient is discharged home in stable condition on 07/20/21. ? ?Newborn Data: ?Birth date:07/18/2021  ?Birth time:5:45 PM  ?Gender:Female  ?Living status:Living  ?Apgars:9 ,9  ?Weight:3470 g  ? ?Magnesium Sulfate received: Yes: Seizure prophylaxis ?BMZ received: No ?Rhophylac:No ?MMR:No ?T-DaP: declines ?Flu: No ?Transfusion:No ? ?Physical exam  ?Vitals:  ? 07/19/21 1945 07/20/21 0002 07/20/21 0454 07/20/21 0802  ?BP: 126/82 (!) 131/94 (!) 135/91 125/86  ?Pulse: 83 81 79 74  ?Resp: 18 16 20 18   ?Temp: 97.6 ?F (36.4 ?C) 97.9 ?F (36.6 ?C) 98.1 ?F (36.7 ?C) 97.6 ?F (36.4 ?C)  ?TempSrc: Oral Oral Oral Oral  ?SpO2: 100% 98% 97% 95%  ?Weight:      ?Height:      ? ?General: alert, cooperative, and no distress ?Lochia: appropriate ?Uterine Fundus: firm ?DVT  Evaluation: No evidence of DVT seen on physical exam. ?Labs: ?Lab Results  ?Component Value Date  ? WBC 13.8 (H) 07/19/2021  ? HGB 10.1 (L) 07/19/2021  ? HCT 27.1 (L) 07/19/2021  ? MCV 75.7 (L) 07/19/2021  ? PLT 237 07/19/2021  ? ?CMP Latest Ref Rng & Units 07/19/2021  ?Glucose 70 - 99 mg/dL 101(H)  ?BUN 6 - 20 mg/dL 6  ?Creatinine 0.44 - 1.00 mg/dL 0.76  ?Sodium 135 - 145 mmol/L 133(L)  ?Potassium 3.5 - 5.1 mmol/L 4.0  ?Chloride 98 - 111 mmol/L 102  ?CO2 22 - 32 mmol/L 22  ?Calcium 8.9 - 10.3 mg/dL 7.6(L)  ?Total Protein 6.5 - 8.1 g/dL 5.6(L)  ?Total Bilirubin 0.3 - 1.2 mg/dL 0.8  ?Alkaline Phos 38 - 126 U/L 187(H)  ?AST 15 - 41 U/L 32  ?ALT 0 - 44 U/L 24  ? ?Edinburgh Score: ?Edinburgh Postnatal Depression Scale Screening Tool 07/19/2021  ?I have been able to laugh and see the funny side of things. 0  ?I have looked forward with enjoyment to things. 0  ?I have blamed myself unnecessarily when things went wrong. 2  ?I have been anxious or worried for no good reason. 0  ?I have felt scared or panicky for no good reason. 0  ?Things have been getting on top of me. 2  ?  I have been so unhappy that I have had difficulty sleeping. 0  ?I have felt sad or miserable. 0  ?I have been so unhappy that I have been crying. 0  ?The thought of harming myself has occurred to me. 0  ?Edinburgh Postnatal Depression Scale Total 4  ? ? ? ? ?After visit meds:  ?Allergies as of 07/20/2021   ? ?   Reactions  ? Latex Rash  ? ?  ? ?  ?Medication List  ?  ? ?STOP taking these medications   ? ?aspirin EC 81 MG tablet ?  ? ?  ? ?TAKE these medications   ? ?NIFEdipine 30 MG 24 hr tablet ?Commonly known as: ADALAT CC ?Take 1 tablet (30 mg total) by mouth daily. ?Start taking on: July 21, 2021 ?  ?prenatal vitamin w/FE, FA 27-1 MG Tabs tablet ?Take 1 tablet by mouth daily at 12 noon. ?  ?Vitamin D 50 MCG (2000 UT) Caps ?Take by mouth. ?  ? ?  ? ? ? ?Discharge home in stable condition ?Infant Feeding: Breast ?Infant Disposition:home with  mother ?Discharge instruction: per After Visit Summary and Postpartum booklet. ?Activity: Advance as tolerated. Pelvic rest for 6 weeks.  ?Diet: routine diet ?Anticipated Birth Control: Unsure ?Postpartum Appointment:1 week for BP check ?Additional Postpartum F/U: BP check 1 week ?Future Appointments:No future appointments. ?Follow up Visit: ? Follow-up Information   ? ? [facility] Follow up in 1 week(s).   ?Specialty: Obstetrics and Gynecology ?Why: Blood pressure check in one week ?Contact information: ?[address] [place]. ?[address] ?[place] [id number] ?[phone] ? ?  ?  ? ?  ?  ? ?  ? ? ? ?  ? ?07/20/2021 ?Kathalene Frames, CNM ? ? ?

## 2021-07-29 ENCOUNTER — Telehealth (HOSPITAL_COMMUNITY): Payer: Self-pay | Admitting: *Deleted

## 2021-07-29 NOTE — Telephone Encounter (Signed)
Hospital Discharge Follow-Up Call: ? ?Patient reports that she is well and has no concerns about her health.  She says she has had a BP check in the office since discharge from hospital and her BP was good.  EPDS today was 3 and she endorses this accurately reflects that she is doing well emotionally. ? ?Patient says that baby is well and she has no concerns about baby's health.  She reports that baby sleeps in a crib.  Reviewed ABCs of Safe Sleep. ?

## 2021-08-01 ENCOUNTER — Other Ambulatory Visit: Payer: Self-pay | Admitting: Obstetrics and Gynecology

## 2021-09-03 ENCOUNTER — Encounter (HOSPITAL_COMMUNITY): Payer: Self-pay | Admitting: Obstetrics and Gynecology

## 2021-09-03 NOTE — Progress Notes (Addendum)
TWO VISITORS ARE ALLOWED TO COME WITH YOU AND STAY IN THE SURGICAL WAITING ROOM ONLY DURING PRE OP AND PROCEDURE DAY OF SURGERY.  ? ?PCP - None ?Cardiologist - n/a ? ?Chest x-ray - n/a ?EKG - DOS ?Stress Test - n/a ?ECHO - n/a ?Cardiac Cath - n/a ? ?ICD Pacemaker/Loop - n/a ? ?Sleep Study -  n/a ?CPAP - none ? ?STOP now taking any Aspirin (unless otherwise instructed by your surgeon), Aleve, Naproxen, Ibuprofen, Motrin, Advil, Goody's, BC's, all herbal medications, fish oil, and all vitamins.  ? ?Coronavirus Screening ?Do you have any of the following symptoms:  ?Cough yes/no: No ?Fever (>100.68F)  yes/no: No ?Runny nose yes/no: No ?Sore throat yes/no: No ?Difficulty breathing/shortness of breath  yes/no: No ? ?Have you traveled in the last 14 days and where? yes/no: No ? ?Patient verbalized understanding of instructions that were given via phone. ?

## 2021-09-05 ENCOUNTER — Ambulatory Visit (HOSPITAL_COMMUNITY): Payer: Medicaid Other | Admitting: Anesthesiology

## 2021-09-05 ENCOUNTER — Ambulatory Visit (HOSPITAL_COMMUNITY)
Admission: RE | Admit: 2021-09-05 | Discharge: 2021-09-05 | Disposition: A | Discharge status: Home / Self Care | Payer: Medicaid Other | Attending: Obstetrics and Gynecology | Admitting: Obstetrics and Gynecology

## 2021-09-05 ENCOUNTER — Ambulatory Visit (HOSPITAL_BASED_OUTPATIENT_CLINIC_OR_DEPARTMENT_OTHER): Payer: Medicaid Other | Admitting: Anesthesiology

## 2021-09-05 ENCOUNTER — Encounter (HOSPITAL_COMMUNITY): Payer: Self-pay | Admitting: Obstetrics and Gynecology

## 2021-09-05 ENCOUNTER — Encounter (HOSPITAL_COMMUNITY)
Admission: RE | Disposition: A | Discharge status: Home / Self Care | Payer: Self-pay | Source: Home / Self Care | Attending: Obstetrics and Gynecology

## 2021-09-05 ENCOUNTER — Other Ambulatory Visit: Payer: Self-pay

## 2021-09-05 DIAGNOSIS — Z302 Encounter for sterilization: Secondary | ICD-10-CM

## 2021-09-05 DIAGNOSIS — I119 Hypertensive heart disease without heart failure: Secondary | ICD-10-CM | POA: Insufficient documentation

## 2021-09-05 HISTORY — PX: LAPAROSCOPIC BILATERAL SALPINGECTOMY: SHX5889

## 2021-09-05 HISTORY — PX: LAPAROSCOPIC TUBAL LIGATION: SHX1937

## 2021-09-05 HISTORY — DX: Allergy status to unspecified drugs, medicaments and biological substances: Z88.9

## 2021-09-05 HISTORY — DX: Essential (primary) hypertension: I10

## 2021-09-05 LAB — POCT PREGNANCY, URINE: Preg Test, Ur: NEGATIVE

## 2021-09-05 LAB — CBC
HCT: 35.2 % — ABNORMAL LOW (ref 36.0–46.0)
Hemoglobin: 12.4 g/dL (ref 12.0–15.0)
MCH: 26.5 pg (ref 26.0–34.0)
MCHC: 35.2 g/dL (ref 30.0–36.0)
MCV: 75.2 fL — ABNORMAL LOW (ref 80.0–100.0)
Platelets: 258 10*3/uL (ref 150–400)
RBC: 4.68 MIL/uL (ref 3.87–5.11)
RDW: 13.9 % (ref 11.5–15.5)
WBC: 4.8 10*3/uL (ref 4.0–10.5)
nRBC: 0 % (ref 0.0–0.2)

## 2021-09-05 LAB — TYPE AND SCREEN
ABO/RH(D): O POS
Antibody Screen: NEGATIVE

## 2021-09-05 SURGERY — LIGATION, FALLOPIAN TUBE, LAPAROSCOPIC
Anesthesia: General | Laterality: Bilateral

## 2021-09-05 MED ORDER — OXYCODONE-ACETAMINOPHEN 5-325 MG PO TABS
1.0000 | ORAL_TABLET | Freq: Four times a day (QID) | ORAL | 0 refills | Status: AC | PRN
Start: 2021-09-05 — End: ?

## 2021-09-05 MED ORDER — SUGAMMADEX SODIUM 200 MG/2ML IV SOLN
INTRAVENOUS | Status: DC | PRN
  Administered 2021-09-05: 200 mg via INTRAVENOUS

## 2021-09-05 MED ORDER — FENTANYL CITRATE (PF) 250 MCG/5ML IJ SOLN
INTRAMUSCULAR | Status: DC | PRN
  Administered 2021-09-05: 50 ug via INTRAVENOUS
  Administered 2021-09-05: 100 ug via INTRAVENOUS
  Administered 2021-09-05: 50 ug via INTRAVENOUS

## 2021-09-05 MED ORDER — DEXAMETHASONE SODIUM PHOSPHATE 10 MG/ML IJ SOLN
INTRAMUSCULAR | Status: DC | PRN
  Administered 2021-09-05: 8 mg via INTRAVENOUS

## 2021-09-05 MED ORDER — HYDRALAZINE HCL 20 MG/ML IJ SOLN
10.0000 mg | Freq: Once | INTRAMUSCULAR | Status: AC
  Administered 2021-09-05: 10 mg via INTRAVENOUS

## 2021-09-05 MED ORDER — PROPOFOL 10 MG/ML IV BOLUS
INTRAVENOUS | Status: DC | PRN
  Administered 2021-09-05: 160 mg via INTRAVENOUS

## 2021-09-05 MED ORDER — LACTATED RINGERS IV SOLN: INTRAVENOUS | Status: DC

## 2021-09-05 MED ORDER — LIDOCAINE 2% (20 MG/ML) 5 ML SYRINGE
INTRAMUSCULAR | Status: AC
  Filled 2021-09-05: qty 5

## 2021-09-05 MED ORDER — BUPIVACAINE HCL (PF) 0.25 % IJ SOLN
INTRAMUSCULAR | Status: DC | PRN
  Administered 2021-09-05: 13 mL

## 2021-09-05 MED ORDER — FENTANYL CITRATE (PF) 250 MCG/5ML IJ SOLN
INTRAMUSCULAR | Status: AC
  Filled 2021-09-05: qty 5

## 2021-09-05 MED ORDER — FENTANYL CITRATE (PF) 100 MCG/2ML IJ SOLN: 25.0000 ug | INTRAMUSCULAR | Status: DC | PRN

## 2021-09-05 MED ORDER — LIDOCAINE 2% (20 MG/ML) 5 ML SYRINGE
INTRAMUSCULAR | Status: DC | PRN
  Administered 2021-09-05: 60 mg via INTRAVENOUS

## 2021-09-05 MED ORDER — ACETAMINOPHEN 500 MG PO TABS
1000.0000 mg | ORAL_TABLET | Freq: Once | ORAL | Status: AC
  Administered 2021-09-05: 1000 mg via ORAL
  Filled 2021-09-05: qty 2

## 2021-09-05 MED ORDER — BUPIVACAINE HCL (PF) 0.25 % IJ SOLN
INTRAMUSCULAR | Status: AC
  Filled 2021-09-05: qty 30

## 2021-09-05 MED ORDER — ROCURONIUM BROMIDE 10 MG/ML (PF) SYRINGE
PREFILLED_SYRINGE | INTRAVENOUS | Status: DC | PRN
  Administered 2021-09-05: 20 mg via INTRAVENOUS
  Administered 2021-09-05: 50 mg via INTRAVENOUS

## 2021-09-05 MED ORDER — SCOPOLAMINE 1 MG/3DAYS TD PT72
1.0000 | MEDICATED_PATCH | TRANSDERMAL | Status: DC
  Administered 2021-09-05: 1.5 mg via TRANSDERMAL
  Filled 2021-09-05: qty 1

## 2021-09-05 MED ORDER — HYDRALAZINE HCL 20 MG/ML IJ SOLN
INTRAMUSCULAR | Status: AC
  Filled 2021-09-05: qty 1

## 2021-09-05 MED ORDER — PROPOFOL 10 MG/ML IV BOLUS
INTRAVENOUS | Status: AC
  Filled 2021-09-05: qty 20

## 2021-09-05 MED ORDER — CHLORHEXIDINE GLUCONATE 0.12 % MT SOLN
15.0000 mL | Freq: Once | OROMUCOSAL | Status: AC
  Administered 2021-09-05: 15 mL via OROMUCOSAL
  Filled 2021-09-05: qty 15

## 2021-09-05 MED ORDER — SUGAMMADEX SODIUM 500 MG/5ML IV SOLN
INTRAVENOUS | Status: AC
  Filled 2021-09-05: qty 5

## 2021-09-05 MED ORDER — KETOROLAC TROMETHAMINE 30 MG/ML IJ SOLN
INTRAMUSCULAR | Status: AC
  Filled 2021-09-05: qty 1

## 2021-09-05 MED ORDER — ROCURONIUM BROMIDE 10 MG/ML (PF) SYRINGE
PREFILLED_SYRINGE | INTRAVENOUS | Status: AC
  Filled 2021-09-05: qty 10

## 2021-09-05 MED ORDER — KETOROLAC TROMETHAMINE 30 MG/ML IJ SOLN
INTRAMUSCULAR | Status: DC | PRN
  Administered 2021-09-05: 30 mg via INTRAVENOUS

## 2021-09-05 MED ORDER — ONDANSETRON HCL 4 MG/2ML IJ SOLN
INTRAMUSCULAR | Status: DC | PRN
  Administered 2021-09-05: 4 mg via INTRAVENOUS

## 2021-09-05 MED ORDER — POVIDONE-IODINE 10 % EX SWAB
2.0000 "application " | Freq: Once | CUTANEOUS | Status: AC
  Administered 2021-09-05: 2 via TOPICAL

## 2021-09-05 MED ORDER — 0.9 % SODIUM CHLORIDE (POUR BTL) OPTIME
TOPICAL | Status: DC | PRN
  Administered 2021-09-05: 1000 mL

## 2021-09-05 MED ORDER — ORAL CARE MOUTH RINSE: 15.0000 mL | Freq: Once | OROMUCOSAL | Status: AC

## 2021-09-05 MED ORDER — IBUPROFEN 600 MG PO TABS: 600.0000 mg | ORAL_TABLET | Freq: Four times a day (QID) | ORAL | 0 refills | Status: AC | PRN

## 2021-09-05 MED ORDER — MIDAZOLAM HCL 2 MG/2ML IJ SOLN
INTRAMUSCULAR | Status: AC
  Filled 2021-09-05: qty 2

## 2021-09-05 MED ORDER — MIDAZOLAM HCL 2 MG/2ML IJ SOLN
INTRAMUSCULAR | Status: DC | PRN
  Administered 2021-09-05: 2 mg via INTRAVENOUS

## 2021-09-05 MED ORDER — CEFAZOLIN SODIUM-DEXTROSE 2-4 GM/100ML-% IV SOLN
2.0000 g | INTRAVENOUS | Status: AC
  Administered 2021-09-05: 2 g via INTRAVENOUS
  Filled 2021-09-05: qty 100

## 2021-09-05 MED ORDER — ONDANSETRON HCL 4 MG/2ML IJ SOLN
INTRAMUSCULAR | Status: AC
  Filled 2021-09-05: qty 2

## 2021-09-05 SURGICAL SUPPLY — 31 items
CATH ROBINSON RED A/P 16FR (CATHETERS) ×2 IMPLANT
DERMABOND ADVANCED (GAUZE/BANDAGES/DRESSINGS) ×1
DERMABOND ADVANCED .7 DNX12 (GAUZE/BANDAGES/DRESSINGS) ×1 IMPLANT
DRSG OPSITE POSTOP 3X4 (GAUZE/BANDAGES/DRESSINGS) IMPLANT
DURAPREP 26ML APPLICATOR (WOUND CARE) ×2 IMPLANT
FILTER SMOKE EVAC LAPAROSHD (FILTER) IMPLANT
GLOVE BIO SURGEON STRL SZ7.5 (GLOVE) ×2 IMPLANT
GLOVE BIOGEL PI IND STRL 7.0 (GLOVE) ×2 IMPLANT
GLOVE BIOGEL PI IND STRL 7.5 (GLOVE) ×1 IMPLANT
GLOVE BIOGEL PI INDICATOR 7.0 (GLOVE) ×2
GLOVE BIOGEL PI INDICATOR 7.5 (GLOVE) ×1
GOWN STRL REUS W/ TWL LRG LVL3 (GOWN DISPOSABLE) ×2 IMPLANT
GOWN STRL REUS W/TWL LRG LVL3 (GOWN DISPOSABLE) ×4
KIT TURNOVER KIT B (KITS) ×2 IMPLANT
LIGASURE VESSEL 5MM BLUNT TIP (ELECTROSURGICAL) ×1 IMPLANT
NDL INSUFFLATION 14GA 120MM (NEEDLE) ×1 IMPLANT
NEEDLE HYPO 22GX1.5 SAFETY (NEEDLE) ×2 IMPLANT
NEEDLE INSUFFLATION 14GA 120MM (NEEDLE) ×2 IMPLANT
PACK LAPAROSCOPY BASIN (CUSTOM PROCEDURE TRAY) ×2 IMPLANT
PACK TRENDGUARD 450 HYBRID PRO (MISCELLANEOUS) IMPLANT
PROTECTOR NERVE ULNAR (MISCELLANEOUS) ×4 IMPLANT
SET TUBE SMOKE EVAC HIGH FLOW (TUBING) ×2 IMPLANT
SLEEVE ENDOPATH XCEL 5M (ENDOMECHANICALS) ×1 IMPLANT
SUT MNCRL AB 3-0 PS2 27 (SUTURE) ×2 IMPLANT
SUT VICRYL 0 UR6 27IN ABS (SUTURE) ×2 IMPLANT
SYR 50ML LL SCALE MARK (SYRINGE) ×1 IMPLANT
TOWEL GREEN STERILE FF (TOWEL DISPOSABLE) ×4 IMPLANT
TRENDGUARD 450 HYBRID PRO PACK (MISCELLANEOUS)
TROCAR XCEL NON-BLD 11X100MML (ENDOMECHANICALS) ×2 IMPLANT
TROCAR XCEL NON-BLD 5MMX100MML (ENDOMECHANICALS) ×2 IMPLANT
WARMER LAPAROSCOPE (MISCELLANEOUS) ×2 IMPLANT

## 2021-09-05 NOTE — Anesthesia Postprocedure Evaluation (Signed)
Anesthesia Post Note ? ?Patient: [name] ? ?Procedure(s) Performed: LAPAROSCOPIC TUBAL LIGATION (Bilateral) ?LAPAROSCOPIC BILATERAL SALPINGECTOMY (Bilateral) ? ?  ? ?Patient location during evaluation: PACU ?Anesthesia Type: General ?Level of consciousness: awake and alert ?Pain management: pain level controlled ?Vital Signs Assessment: post-procedure vital signs reviewed and stable ?Respiratory status: spontaneous breathing, nonlabored ventilation, respiratory function stable and patient connected to nasal cannula oxygen ?Cardiovascular status: blood pressure returned to baseline and stable ?Postop Assessment: no apparent nausea or vomiting ?Anesthetic complications: no ? ? ?No notable events documented. ? ?Last Vitals:  ?Vitals:  ? 09/05/21 1725 09/05/21 1730  ?BP: (!) 157/96   ?Pulse: (!) 54 85  ?Resp: 16 16  ?Temp:    ?SpO2:  100%  ?  ?Last Pain:  ?Vitals:  ? 09/05/21 1705  ?PainSc: 0-No pain  ? ? ?  ?  ?  ?  ?  ?  ? ?March Rummage Aylla Huffine ? ? ? ? ?

## 2021-09-05 NOTE — Anesthesia Preprocedure Evaluation (Signed)
Anesthesia Evaluation  ?Patient identified by MRN, date of birth, ID band ?Patient awake ? ? ? ?Reviewed: ?Allergy & Precautions, NPO status , Patient's Chart, lab work & pertinent test results ? ?Airway ?Mallampati: II ? ?TM Distance: >3 FB ?Neck ROM: Full ? ? ? Dental ?no notable dental hx. ? ?  ?Pulmonary ?neg pulmonary ROS,  ?  ?Pulmonary exam normal ? ? ? ? ? ? ? Cardiovascular ?hypertension, Pt. on medications ? ?Rhythm:Regular Rate:Normal ? ? ?  ?Neuro/Psych ?negative neurological ROS ? negative psych ROS  ? GI/Hepatic ?negative GI ROS, Neg liver ROS,   ?Endo/Other  ?negative endocrine ROS ? Renal/GU ?negative Renal ROS  ?negative genitourinary ?  ?Musculoskeletal ? ? Abdominal ?Normal abdominal exam  (+)   ?Peds ? Hematology ? ?(+) Blood dyscrasia, anemia ,   ?Anesthesia Other Findings ? ? Reproductive/Obstetrics ?(+) Breast feeding  ?Elective sterilization  ? ?  ? ? ? ? ? ? ? ? ? ? ? ? ? ?  ?  ? ? ? ? ? ? ? ? ?Anesthesia Physical ?Anesthesia Plan ? ?ASA: 2 ? ?Anesthesia Plan: General  ? ?Post-op Pain Management:   ? ?Induction: Intravenous ? ?PONV Risk Score and Plan: 3 and Ondansetron, Dexamethasone, Midazolam and Treatment may vary due to age or medical condition ? ?Airway Management Planned: Mask and Oral ETT ? ?Additional Equipment: None ? ?Intra-op Plan:  ? ?Post-operative Plan: Extubation in OR ? ?Informed Consent: I have reviewed the patients History and Physical, chart, labs and discussed the procedure including the risks, benefits and alternatives for the proposed anesthesia with the patient or authorized representative who has indicated his/her understanding and acceptance.  ? ? ? ?Dental advisory given ? ?Plan Discussed with: CRNA ? ?Anesthesia Plan Comments:   ? ? ? ? ? ? ?Anesthesia Quick Evaluation ? ?

## 2021-09-05 NOTE — Anesthesia Procedure Notes (Signed)
Procedure Name: Intubation ?Date/Time: 09/05/2021 3:04 PM ?Performed by: Inda Coke, CRNA ?Pre-anesthesia Checklist: Patient identified, Emergency Drugs available, Suction available and Patient being monitored ?Patient Re-evaluated:Patient Re-evaluated prior to induction ?Oxygen Delivery Method: Circle System Utilized ?Preoxygenation: Pre-oxygenation with 100% oxygen ?Induction Type: IV induction ?Ventilation: Mask ventilation without difficulty ?Laryngoscope Size: Mac and 3 ?Grade View: Grade I ?Tube type: Oral ?Tube size: 7.0 mm ?Number of attempts: 1 ?Airway Equipment and Method: Stylet and Oral airway ?Placement Confirmation: ETT inserted through vocal cords under direct vision, positive ETCO2 and breath sounds checked- equal and bilateral ?Secured at: 22 cm ?Tube secured with: Tape ?Dental Injury: Teeth and Oropharynx as per pre-operative assessment  ? ? ? ? ?

## 2021-09-05 NOTE — Op Note (Signed)
Preop Diagnosis: Desires Surgical Sterilzation ? ?Postop Diagnosis: Desires Surgical Sterilzation ? ?Procedure: LAPAROSCOPIC BILATERAL SALPINGECTOMY ? ?Anesthesia: General  ? ?Attending: Purcell Nails, MD  ? ?Assistant: Leisure centre manager ? ?Findings: ?Normal appearing bilateral ovaries and tubes ? ?Pathology: ?Bilateral tubes ? ?Fluids: ?1000 cc ? ?UOP: ?350 cc ? ?EBL: ?5 cc ? ?Complications: ?None ? ?Procedure: ?The patient was taken to the operating room after the risks, benefits, alternatives, complications, treatment options, and expected outcomes were discussed with the patient. The patient verbalized understanding, the patient concurred with the proposed plan and consent signed and witnessed. The patient was taken to the Operating Room, identified as [name]  and procedure verified as sterilization procedure via laparoscopic bilateral salpingectomy. A Time Out was held and the above information confirmed. ? ?The patient was placed under general anesthesia per anesthesia staff, the patient was placed in modified dorsal lithotomy position and was prepped, draped, and catheterized in the normal, sterile fashion.  The cervix was visualized and an intrauterine manipulator was placed. A  10 mm umbilical incision was then performed. Veress needle was passed and pneumoperitoneum was established. A 10 mm trocar was advanced into the intraabdominal cavity, the laparoscope was introduced and findings as noted above.  A 82mm incision was made suprapubically and 49mm trocar advanced into the intraabdominal cavity under direct visualization.  The same was done in the LLQ.  The right fallopian tube was identified and carried out to its fimbriated end and excised with the Ligasure.  The same was done on the contralateral side.  The laparoscope was removed and pneumoperitoneum released.  The fascia was repaired with an interrupted stitch of 0 vicryl and the skin was reapproximated with 3-0 monocryl via  subcuticular stitch.  Dermabond was applied to close the 2mm incisions and used to reinforce the 23mm umbilical incision. ? ?Sponge, lap and needle count were correct.  Patient tolerated the procedure well and was returned to the recovery room in good condition.  ?

## 2021-09-05 NOTE — H&P (Addendum)
[name] is an 26 y.o. female. Pt presents for laparoscopic sterilization.   ? ?Pertinent Gynecological History: ?Regular cycles ? ?LMP 08/11/21 but spotting since delivery.  Breast feeding. ?Patient's last menstrual period was 10/10/2020. ?  ? ?Past Medical History:  ?Diagnosis Date  ? History of postpartum hemorrhage   ? History of seasonal allergies   ? Hypertension   ? PCOS (polycystic ovarian syndrome)   ? ? ?Past Surgical History:  ?Procedure Laterality Date  ? MANDIBLE FRACTURE SURGERY    ? NECK SURGERY    ? TONSILLECTOMY    ? ? ?History reviewed. No pertinent family history. ? ?Social History:  reports that she has never smoked. She has never used smokeless tobacco. She reports that she does not drink alcohol and does not use drugs. ? ?Allergies:  ?Allergies  ?Allergen Reactions  ? Latex Rash  ? ? ?Medications Prior to Admission  ?Medication Sig Dispense Refill Last Dose  ? cetirizine (ZYRTEC) 10 MG tablet Take 10 mg by mouth daily.   09/05/2021  ? NIFEdipine (ADALAT CC) 30 MG 24 hr tablet Take 1 tablet (30 mg total) by mouth daily. (Patient not taking: Reported on 09/01/2021) 30 tablet 0 08/05/2021  ? ? ?Review of Systems ? ?Blood pressure (!) 160/115, pulse 61, resp. rate 18, height 5\' 9"  (1.753 m), weight 103.4 kg, last menstrual period 10/10/2020, SpO2 97 %, currently breastfeeding. ?Physical Exam ? ?Lungs CTA  ?CV RRR ?Abd soft, NT ?Ext no calf tenderness ? ?Results for orders placed or performed during the hospital encounter of 09/05/21 (from the past 24 hour(s))  ?Pregnancy, urine POC     Status: None  ? Collection Time: 09/05/21 12:25 PM  ?Result Value Ref Range  ? Preg Test, Ur NEGATIVE NEGATIVE  ?Type and screen     Status: None  ? Collection Time: 09/05/21 12:45 PM  ?Result Value Ref Range  ? ABO/RH(D) O POS   ? Antibody Screen NEG   ? Sample Expiration    ?  09/08/2021,2359 ?Performed at [facility], [address] [place], [place] ?  ?CBC     Status: Abnormal  ? Collection  Time: 09/05/21 12:54 PM  ?Result Value Ref Range  ? WBC 4.8 4.0 - 10.5 K/uL  ? RBC 4.68 3.87 - 5.11 MIL/uL  ? Hemoglobin 12.4 12.0 - 15.0 g/dL  ? HCT 35.2 (L) 36.0 - 46.0 %  ? MCV 75.2 (L) 80.0 - 100.0 fL  ? MCH 26.5 26.0 - 34.0 pg  ? MCHC 35.2 30.0 - 36.0 g/dL  ? RDW 13.9 11.5 - 15.5 %  ? Platelets 258 150 - 400 K/uL  ? nRBC 0.0 0.0 - 0.2 %  ? ? ?No results found. ? ?Assessment/Plan: ?P3 s/p delivery on 07-18-21 desiring permanent sterilization and agreeable with bilateral salpingectomy. ?Tubal papers signed on 06-25-21 ?Risks benefits alternatives reviewed including but not limited to bleeding infection injury risk of regret risk of failure with possible ectopic.  Questions answered and consent signed and witnessed. ?Pt had preeclampsia and was on nifedipine with normalization of BPs ?May consider restarting BP med as it is elevated today with referral to a PCP ?She states she does not currently have a PCP ? ?02-09-2000 ?09/05/2021, 2:16 PM ? ?

## 2021-09-05 NOTE — Transfer of Care (Signed)
Immediate Anesthesia Transfer of Care Note ? ?Patient: [name] ? ?Procedure(s) Performed: LAPAROSCOPIC TUBAL LIGATION (Bilateral) ?LAPAROSCOPIC BILATERAL SALPINGECTOMY (Bilateral) ? ?Patient Location: PACU ? ?Anesthesia Type:General ? ?Level of Consciousness: awake and alert  ? ?Airway & Oxygen Therapy: Patient Spontanous Breathing and Patient connected to nasal cannula oxygen ? ?Post-op Assessment: Report given to RN and Post -op Vital signs reviewed and stable ? ?Post vital signs: Reviewed and stable ? ?Last Vitals:  ?Vitals Value Taken Time  ?BP 165/100 09/05/21 1632  ?Temp 36.7 ?C 09/05/21 1630  ?Pulse 55 09/05/21 1634  ?Resp 18 09/05/21 1634  ?SpO2 99 % 09/05/21 1634  ?Vitals shown include unvalidated device data. ? ?Last Pain:  ?Vitals:  ? 09/05/21 1237  ?PainSc: 0-No pain  ?   ? ?  ? ?Complications: No notable events documented. ?

## 2021-09-06 ENCOUNTER — Encounter (HOSPITAL_COMMUNITY): Payer: Self-pay | Admitting: Obstetrics and Gynecology

## 2021-09-09 LAB — SURGICAL PATHOLOGY

## 2022-04-17 IMAGING — US US MFM OB DETAIL+14 WK
1 series · 13 of 28 positions shown · non-contrast
Comparison: none

[Series 1: us mfm ob detail+14 wk · 145 acquisitions, 13 frames shown]
[im 6/145]
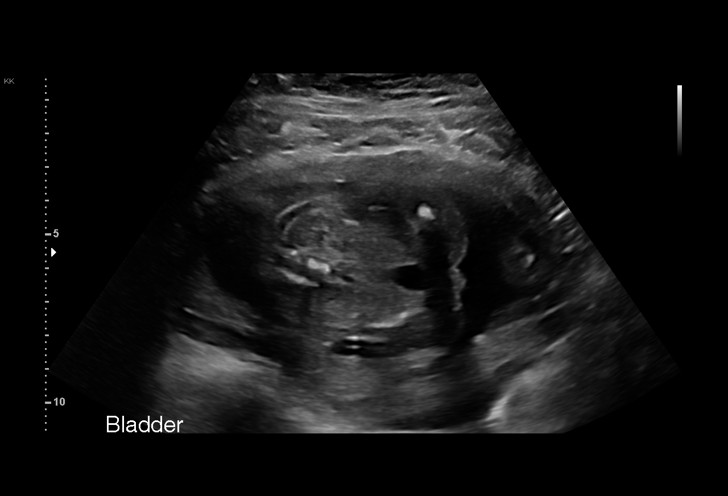
[im 17/145]
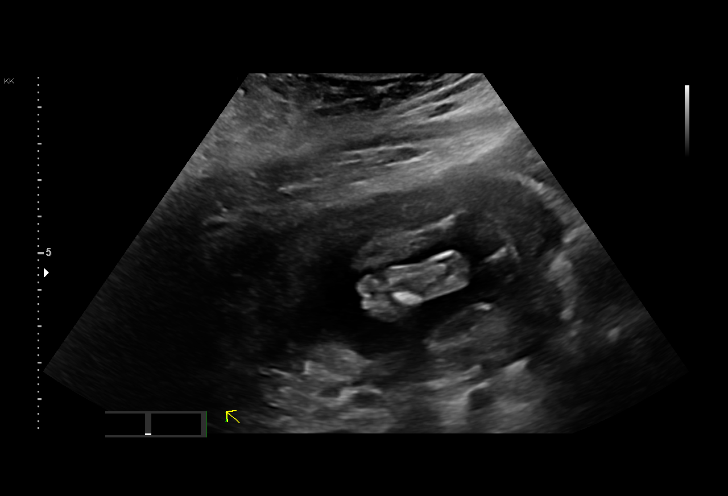
[im 27/145]
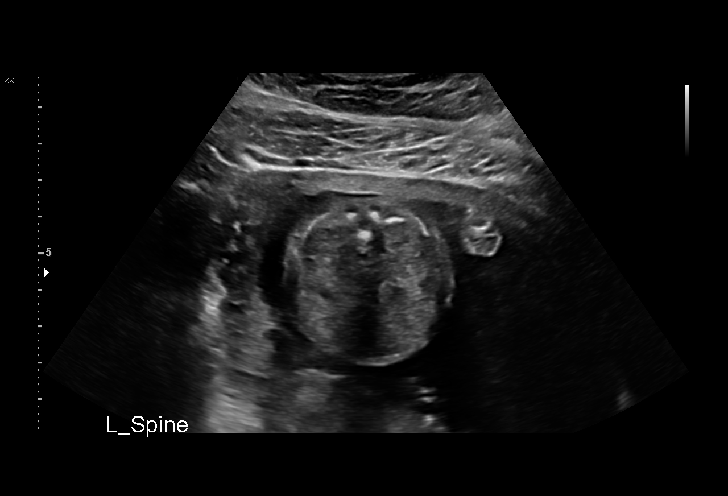
[im 38/145]
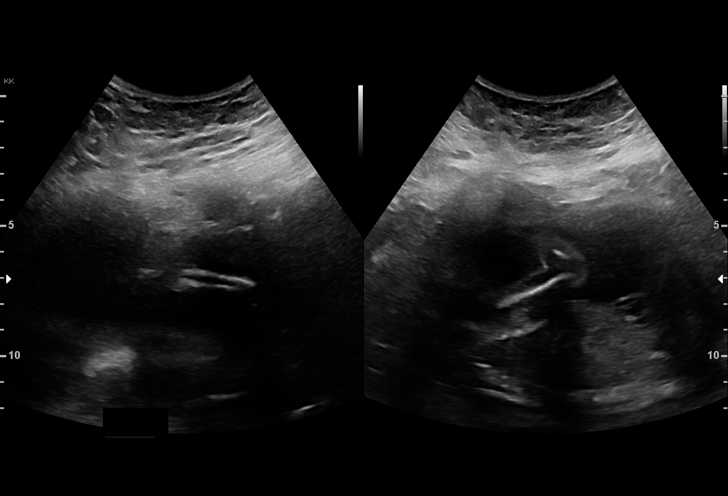
[im 49/145]
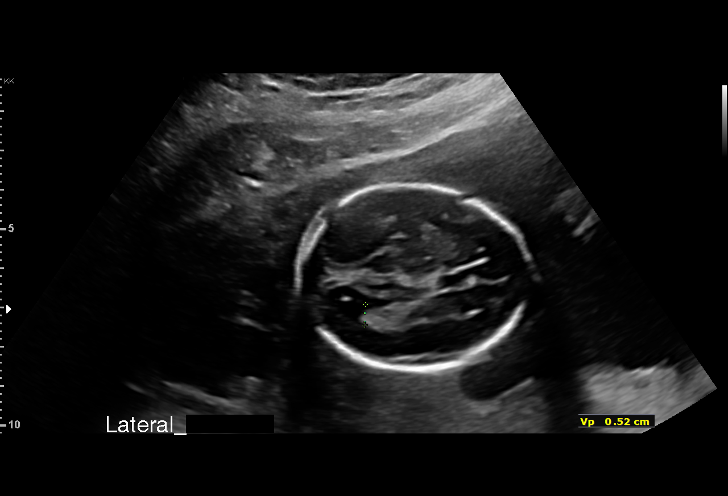
[im 59/145]
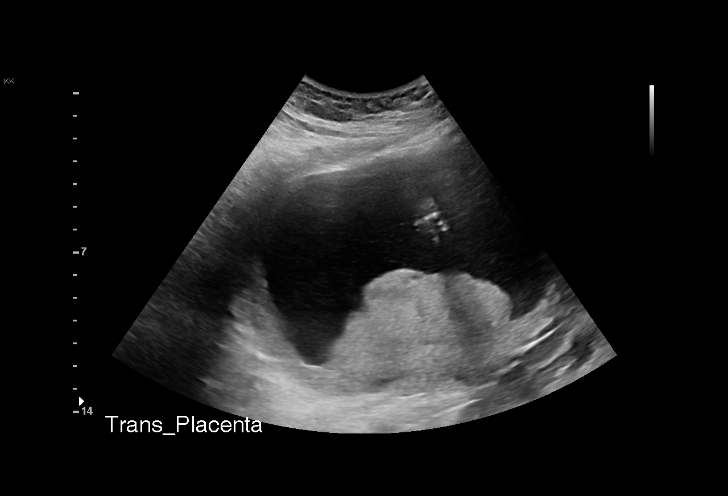
[im 75/145]
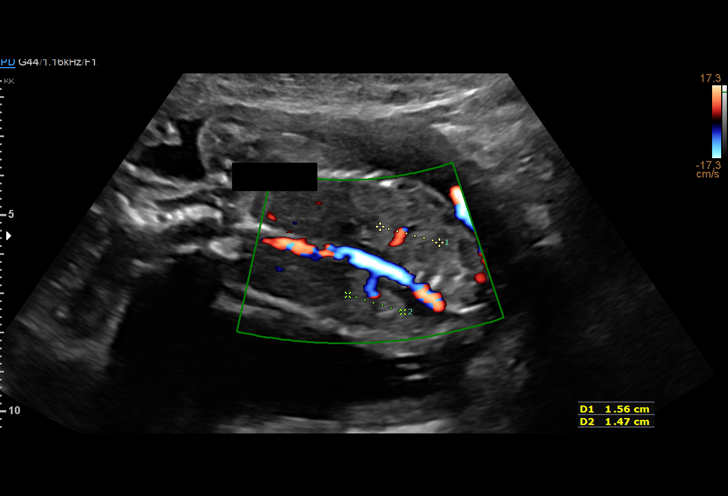
[im 86/145]
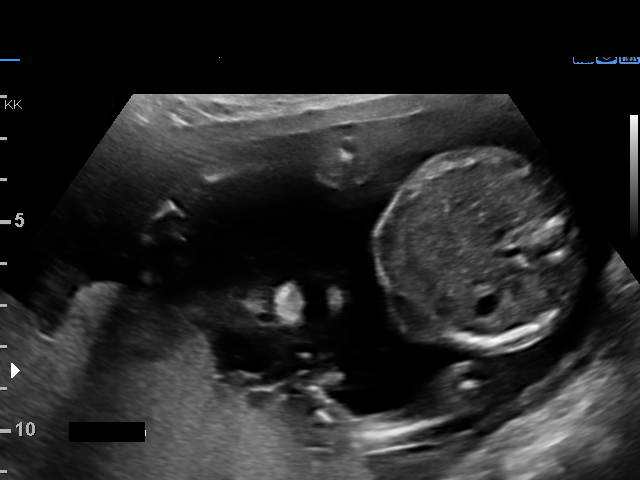
[im 97/145]
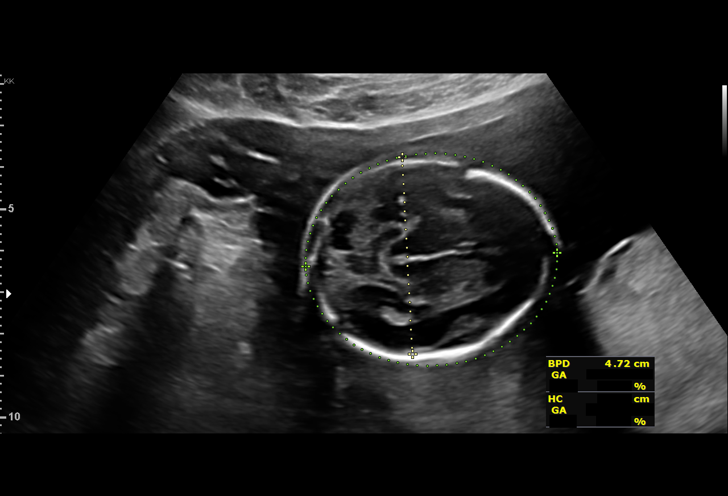
[im 107/145]
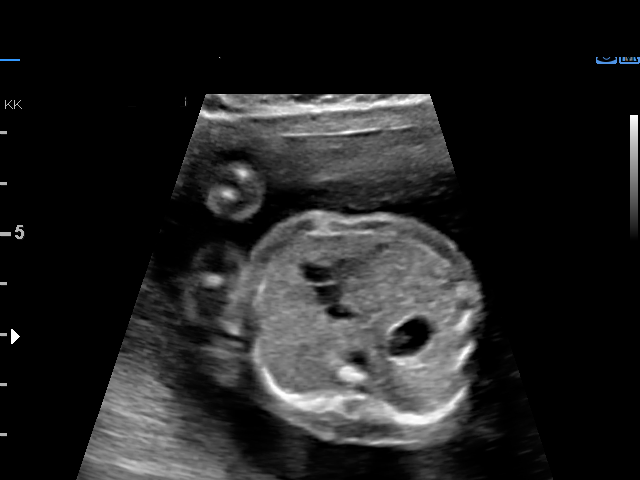
[im 118/145]
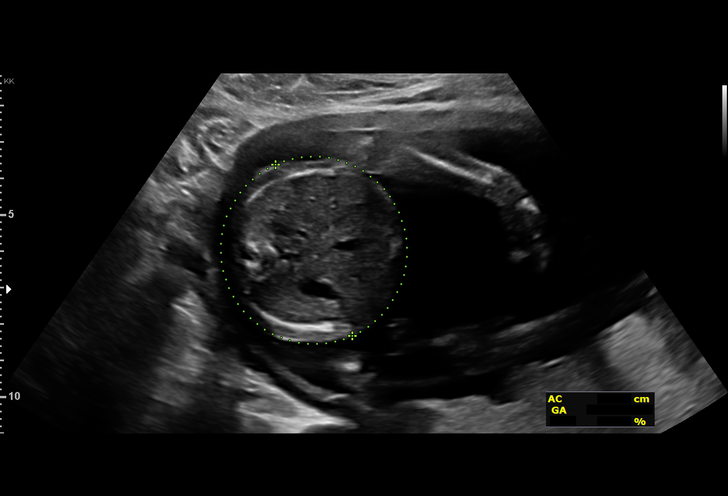
[im 129/145]
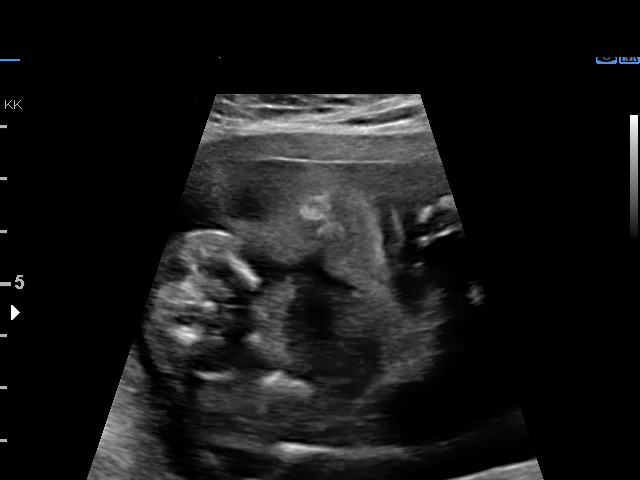
[im 139/145]
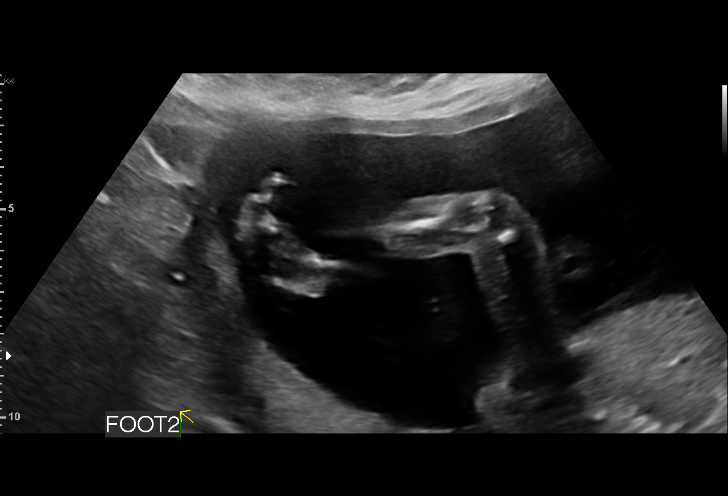

[13 of 28 positions shown; findings below may reference images not displayed]

Obstetrics &
                                                            Gynecology
                                                            [id number]
                                                            [place].
                   CNM

Indications

 Obesity complicating pregnancy, second
 trimester
 20 weeks gestation of pregnancy
 Encounter for antenatal screening for
 malformations
 Poor obstetrical history (Postpartum
 Hemorrhage)
 Anemia during pregnancy in second trimester
 Poor obstetric history: Previous
 preeclampsia / eclampsia/gestational HTN
 Low Risk NIPS
 Short interval between pregancies, 2nd
 trimester
Vital Signs

 BMI:
Fetal Evaluation

 Num Of Fetuses:         1
 Fetal Heart Rate(bpm):  148
 Cardiac Activity:       Observed
 Presentation:           Transverse, head to maternal right
 Placenta:               Posterior
 P. Cord Insertion:      Visualized

 Amniotic Fluid
 AFI FV:      Within normal limits

                             Largest Pocket(cm)

Biometry

 BPD:      47.1  mm     G. Age:  20w 2d         30  %    CI:        73.25   %    70 - 86
                                                         FL/HC:      18.1   %    15.9 -
 HC:      174.9  mm     G. Age:  20w 0d         14  %    HC/AC:      1.09        1.06 -
 AC:      160.7  mm     G. Age:  21w 1d         59  %    FL/BPD:     67.1   %
 FL:       31.6  mm     G. Age:  19w 6d         15  %    FL/AC:      19.7   %    20 - 24
 CER:      21.3  mm     G. Age:  20w 1d         47  %
 NFT:       3.8  mm
 LV:        5.2  mm
 CM:        5.1  mm

 Est. FW:     357  gm    0 lb 13 oz      33  %
OB History

 Gravidity:    3         Term:   2        Prem:   0        SAB:   0
 TOP:          0       Ectopic:  0        Living: 2
Gestational Age

 LMP:           20w 5d        Date:  10/10/20                 EDD:   07/17/21
 U/S Today:     20w 2d                                        EDD:   07/20/21
 Best:          20w 5d     Det. By:  LMP  (10/10/20)          EDD:   07/17/21
Anatomy

 Cranium:               Appears normal         Aortic Arch:            Appears normal
 Cavum:                 Appears normal         Ductal Arch:            Appears normal
 Ventricles:            Appears normal         Diaphragm:              Appears normal
 Choroid Plexus:        Appears normal         Stomach:                Appears normal, left
                                                                       sided
 Cerebellum:            Appears normal         Abdomen:                Appears normal
 Posterior Fossa:       Appears normal         Abdominal Wall:         Appears nml (cord
                                                                       insert, abd wall)
 Nuchal Fold:           Appears normal         Cord Vessels:           Appears normal (3
                                                                       vessel cord)
 Face:                  Appears normal         Kidneys:                Appear normal
                        (orbits and profile)
 Lips:                  Appears normal         Bladder:                Appears normal
 Thoracic:              Appears normal         Spine:                  Appears normal
 Heart:                 Not well visualized    Upper Extremities:      Appears normal
 RVOT:                  Appears normal         Lower Extremities:      Appears normal
 LVOT:                  Appears normal

 Other:  Technically difficult due to fetal position and motion. Heels and Hands
         visualized.
Cervix Uterus Adnexa
 Cervix
 Length:            3.8  cm.
 Normal appearance by transabdominal scan.

 Adnexa
 No abnormality visualized.
Impression

 G3 P2.  Patient is here for fetal anatomy scan.
 On cell-free fetal DNA screening, the risks of fetal
 aneuploidies are not increased .
 and 0400).  Her first pregnancy was complicated by
 gestational hypertension/preeclampsia.  Patient takes low-
 dose aspirin prophylaxis.
 She has hemoglobin C trait and her partner is not a carrier

 We performed fetal anatomy scan. No makers of
 aneuploidies or fetal structural defects are seen. Fetal
 biometry is consistent with her previously-established dates.
 Amniotic fluid is normal and good fetal activity is seen.
 Patient understands the limitations of ultrasound in detecting
 fetal anomalies.
Recommendations

 -An appointment was made for her to return in 4 weeks for
 completion of fetal anatomy (four-chamber view).
 -Fetal growth assessments every 4 weeks (history of
 preeclampsia).
                 Oner, Jannig
